# Patient Record
Sex: Female | Born: 1975 | Race: Black or African American | Hispanic: No | Marital: Single | Smoking: Current every day smoker
Health system: Southern US, Community
[De-identification: ages and names within clinical notes are randomized; demographics above are authoritative.]

## PROBLEM LIST (undated history)

## (undated) DIAGNOSIS — J449 Chronic obstructive pulmonary disease, unspecified: Secondary | ICD-10-CM

## (undated) DIAGNOSIS — E119 Type 2 diabetes mellitus without complications: Secondary | ICD-10-CM

## (undated) DIAGNOSIS — I10 Essential (primary) hypertension: Secondary | ICD-10-CM

## (undated) DIAGNOSIS — M1990 Unspecified osteoarthritis, unspecified site: Secondary | ICD-10-CM

---

## 2009-03-07 ENCOUNTER — Emergency Department: Payer: Self-pay

## 2009-12-11 ENCOUNTER — Emergency Department: Payer: Self-pay | Admitting: Emergency Medicine

## 2010-05-24 DIAGNOSIS — G4733 Obstructive sleep apnea (adult) (pediatric): Secondary | ICD-10-CM | POA: Insufficient documentation

## 2010-07-10 ENCOUNTER — Emergency Department: Payer: Self-pay | Admitting: Emergency Medicine

## 2011-01-18 DIAGNOSIS — J4520 Mild intermittent asthma, uncomplicated: Secondary | ICD-10-CM | POA: Insufficient documentation

## 2013-02-28 DIAGNOSIS — F17200 Nicotine dependence, unspecified, uncomplicated: Secondary | ICD-10-CM | POA: Insufficient documentation

## 2014-09-04 ENCOUNTER — Encounter: Payer: Self-pay | Admitting: *Deleted

## 2014-09-04 ENCOUNTER — Emergency Department
Admission: EM | Admit: 2014-09-04 | Discharge: 2014-09-04 | Disposition: A | Discharge status: Home / Self Care | Payer: Self-pay | Attending: Emergency Medicine | Admitting: Emergency Medicine

## 2014-09-04 DIAGNOSIS — J209 Acute bronchitis, unspecified: Secondary | ICD-10-CM | POA: Insufficient documentation

## 2014-09-04 DIAGNOSIS — J40 Bronchitis, not specified as acute or chronic: Secondary | ICD-10-CM

## 2014-09-04 DIAGNOSIS — E119 Type 2 diabetes mellitus without complications: Secondary | ICD-10-CM | POA: Insufficient documentation

## 2014-09-04 DIAGNOSIS — Z720 Tobacco use: Secondary | ICD-10-CM | POA: Insufficient documentation

## 2014-09-04 DIAGNOSIS — I10 Essential (primary) hypertension: Secondary | ICD-10-CM | POA: Insufficient documentation

## 2014-09-04 HISTORY — DX: Type 2 diabetes mellitus without complications: E11.9

## 2014-09-04 HISTORY — DX: Unspecified osteoarthritis, unspecified site: M19.90

## 2014-09-04 HISTORY — DX: Essential (primary) hypertension: I10

## 2014-09-04 HISTORY — DX: Chronic obstructive pulmonary disease, unspecified: J44.9

## 2014-09-04 MED ORDER — HYDROCOD POLST-CPM POLST ER 10-8 MG/5ML PO SUER: 5.0000 mL | Freq: Two times a day (BID) | ORAL | Status: DC

## 2014-09-04 MED ORDER — HYDROCOD POLST-CPM POLST ER 10-8 MG/5ML PO SUER
5.0000 mL | Freq: Once | ORAL | Status: AC
Start: 2014-09-04 — End: 2014-09-04
  Administered 2014-09-04: 5 mL via ORAL
  Filled 2014-09-04: qty 5

## 2014-09-04 MED ORDER — AZITHROMYCIN 250 MG PO TABS
500.0000 mg | ORAL_TABLET | Freq: Once | ORAL | Status: AC
  Administered 2014-09-04: 500 mg via ORAL
  Filled 2014-09-04: qty 2

## 2014-09-04 MED ORDER — AZITHROMYCIN 250 MG PO TABS: 250.0000 mg | ORAL_TABLET | Freq: Every day | ORAL | Status: DC

## 2014-09-04 MED ORDER — ALBUTEROL SULFATE HFA 108 (90 BASE) MCG/ACT IN AERS: 2.0000 | INHALATION_SPRAY | RESPIRATORY_TRACT | Status: DC | PRN

## 2014-09-04 NOTE — Discharge Instructions (Signed)
1. Take antibiotic as prescribed (azithromycin 250 mg 4 days). 2. Take cough medicine as needed (Tussionex 7 day supply). 3. Use Albuterol inhaler 2 puffs every 4 hours as needed for wheezing. 4. Return to the ER for worsening symptoms, persistent vomiting, difficulty breathing or other concerns.  How to Use an Inhaler Proper inhaler technique is very important. Good technique ensures that the medicine reaches the lungs. Poor technique results in depositing the medicine on the tongue and back of the throat rather than in the airways. If you do not use the inhaler with good technique, the medicine will not help you. STEPS TO FOLLOW IF USING AN INHALER WITHOUT AN EXTENSION TUBE 1. Remove the cap from the inhaler. 2. If you are using the inhaler for the first time, you will need to prime it. Shake the inhaler for 5 seconds and release four puffs into the air, away from your face. Ask your health care provider or pharmacist if you have questions about priming your inhaler. 3. Shake the inhaler for 5 seconds before each breath in (inhalation). 4. Position the inhaler so that the top of the canister faces up. 5. Put your index finger on the top of the medicine canister. Your thumb supports the bottom of the inhaler. 6. Open your mouth. 7. Either place the inhaler between your teeth and place your lips tightly around the mouthpiece, or hold the inhaler 1-2 inches away from your open mouth. If you are unsure of which technique to use, ask your health care provider. 8. Breathe out (exhale) normally and as completely as possible. 9. Press the canister down with your index finger to release the medicine. 10. At the same time as the canister is pressed, inhale deeply and slowly until your lungs are completely filled. This should take 4-6 seconds. Keep your tongue down. 11. Hold the medicine in your lungs for 5-10 seconds (10 seconds is best). This helps the medicine get into the small airways of your  lungs. 12. Breathe out slowly, through pursed lips. Whistling is an example of pursed lips. 13. Wait at least 15-30 seconds between puffs. Continue with the above steps until you have taken the number of puffs your health care provider has ordered. Do not use the inhaler more than your health care provider tells you. 14. Replace the cap on the inhaler. 15. Follow the directions from your health care provider or the inhaler insert for cleaning the inhaler. STEPS TO FOLLOW IF USING AN INHALER WITH AN EXTENSION (SPACER) 1. Remove the cap from the inhaler. 2. If you are using the inhaler for the first time, you will need to prime it. Shake the inhaler for 5 seconds and release four puffs into the air, away from your face. Ask your health care provider or pharmacist if you have questions about priming your inhaler. 3. Shake the inhaler for 5 seconds before each breath in (inhalation). 4. Place the open end of the spacer onto the mouthpiece of the inhaler. 5. Position the inhaler so that the top of the canister faces up and the spacer mouthpiece faces you. 6. Put your index finger on the top of the medicine canister. Your thumb supports the bottom of the inhaler and the spacer. 7. Breathe out (exhale) normally and as completely as possible. 8. Immediately after exhaling, place the spacer between your teeth and into your mouth. Close your lips tightly around the spacer. 9. Press the canister down with your index finger to release the medicine. 10. At  the same time as the canister is pressed, inhale deeply and slowly until your lungs are completely filled. This should take 4-6 seconds. Keep your tongue down and out of the way. 11. Hold the medicine in your lungs for 5-10 seconds (10 seconds is best). This helps the medicine get into the small airways of your lungs. Exhale. 12. Repeat inhaling deeply through the spacer mouthpiece. Again hold that breath for up to 10 seconds (10 seconds is best). Exhale  slowly. If it is difficult to take this second deep breath through the spacer, breathe normally several times through the spacer. Remove the spacer from your mouth. 13. Wait at least 15-30 seconds between puffs. Continue with the above steps until you have taken the number of puffs your health care provider has ordered. Do not use the inhaler more than your health care provider tells you. 14. Remove the spacer from the inhaler, and place the cap on the inhaler. 15. Follow the directions from your health care provider or the inhaler insert for cleaning the inhaler and spacer. If you are using different kinds of inhalers, use your quick relief medicine to open the airways 10-15 minutes before using a steroid if instructed to do so by your health care provider. If you are unsure which inhalers to use and the order of using them, ask your health care provider, nurse, or respiratory therapist. If you are using a steroid inhaler, always rinse your mouth with water after your last puff, then gargle and spit out the water. Do not swallow the water. AVOID:  Inhaling before or after starting the spray of medicine. It takes practice to coordinate your breathing with triggering the spray.  Inhaling through the nose (rather than the mouth) when triggering the spray. HOW TO DETERMINE IF YOUR INHALER IS FULL OR NEARLY EMPTY You cannot know when an inhaler is empty by shaking it. A few inhalers are now being made with dose counters. Ask your health care provider for a prescription that has a dose counter if you feel you need that extra help. If your inhaler does not have a counter, ask your health care provider to help you determine the date you need to refill your inhaler. Write the refill date on a calendar or your inhaler canister. Refill your inhaler 7-10 days before it runs out. Be sure to keep an adequate supply of medicine. This includes making sure it is not expired, and that you have a spare inhaler.  SEEK  MEDICAL CARE IF:   Your symptoms are only partially relieved with your inhaler.  You are having trouble using your inhaler.  You have some increase in phlegm. SEEK IMMEDIATE MEDICAL CARE IF:   You feel little or no relief with your inhalers. You are still wheezing and are feeling shortness of breath or tightness in your chest or both.  You have dizziness, headaches, or a fast heart rate.  You have chills, fever, or night sweats.  You have a noticeable increase in phlegm production, or there is blood in the phlegm. MAKE SURE YOU:   Understand these instructions.  Will watch your condition.  Will get help right away if you are not doing well or get worse. Document Released: 01/28/2000 Document Revised: 11/20/2012 Document Reviewed: 08/29/2012 [facility] Patient Information 2015 [place], [place]. This information is not intended to replace advice given to you by your health care provider. Make sure you discuss any questions you have with your health care provider.

## 2014-09-04 NOTE — ED Notes (Signed)
Pt has a cough and sore throat.  Sx began this am.  cig smoker.  Nonproductive cough.  No fever.

## 2014-09-04 NOTE — ED Notes (Signed)
Patient to ED with multiple medical complaints. Cough, congestion, swelling, sore throat.

## 2014-09-04 NOTE — ED Provider Notes (Signed)
[facility] Emergency Department Provider Note  ____________________________________________  Time seen: Approximately 2:03 AM  I have reviewed the triage vital signs and the nursing notes.   HISTORY  Chief Complaint Cough and Sore Throat    HPI Tamara Higgins is a 39 y.o. female who presents to the ED from home with a one-day history of nonproductive cough and sore throat associated with chills. Denies fever, chest pain, shortness of breath, abdominal pain, diarrhea, headache. States she had 2 episodes of post tussive emesis.   Past Medical History  Diagnosis Date  . Hypertension   . Diabetes mellitus without complication   . Arthritis   . COPD (chronic obstructive pulmonary disease)     There are no active problems to display for this patient.   Past Surgical History  Procedure Laterality Date  . Cesarean section      No current outpatient prescriptions on file.  Allergies Ciprofloxacin  No family history on file.  Social History History  Substance Use Topics  . Smoking status: Current Every Day Smoker  . Smokeless tobacco: Not on file  . Alcohol Use: No    Review of Systems Constitutional: No fever. Positive for chills Eyes: No visual changes. ENT: Positive for sore throat. Cardiovascular: Denies chest pain. Respiratory: Positive for nonproductive cough. Denies shortness of breath. Gastrointestinal: No abdominal pain.  No nausea, no vomiting.  No diarrhea.  No constipation. Genitourinary: Negative for dysuria. Musculoskeletal: Negative for back pain. Skin: Negative for rash. Neurological: Negative for headaches, focal weakness or numbness.  10-point ROS otherwise negative.  ____________________________________________   PHYSICAL EXAM:  VITAL SIGNS: ED Triage Vitals  Enc Vitals Group     BP 09/04/14 0136 176/106 mmHg     Pulse Rate 09/04/14 0136 103     Resp 09/04/14 0136 22     Temp 09/04/14 0136 98.3 F (36.8 C)      Temp Source 09/04/14 0136 Oral     SpO2 09/04/14 0136 96 %     Weight 09/04/14 0136 322 lb (146.058 kg)     Height 09/04/14 0136  (1.575 m)     Head Cir --      Peak Flow --      Pain Score 09/04/14 0137 8     Pain Loc --      Pain Edu? --      Excl. in GC? --     Constitutional: Alert and oriented. Well appearing and in no acute distress. Eyes: Conjunctivae are normal. PERRL. EOMI. Head: Atraumatic. Nose: Nasal congestion noted. Mouth/Throat: Mucous membranes are moist.  Oropharynx mildly erythematous. No tonsillar swelling, exudates or peritonsillar abscess. There is no hoarse or muffled voice. There is no drooling. Neck: No stridor.   Hematological/Lymphatic/Immunilogical: No cervical lymphadenopathy. Cardiovascular: Normal rate, regular rhythm. Grossly normal heart sounds.  Good peripheral circulation. Respiratory: Normal respiratory effort.  No retractions. Lungs CTAB. Coarse nonproductive cough noted. Gastrointestinal: Obese. Soft and nontender. No distention. No abdominal bruits. No CVA tenderness. Musculoskeletal: No lower extremity tenderness nor edema.  No joint effusions. Neurologic:  Normal speech and language. No gross focal neurologic deficits are appreciated. No gait instability. Skin:  Skin is warm, dry and intact. No rash noted. Psychiatric: Mood and affect are normal. Speech and behavior are normal.  ____________________________________________   LABS (all labs ordered are listed, but only abnormal results are displayed)  Labs Reviewed - No data to display ____________________________________________  EKG  None ____________________________________________  RADIOLOGY  None ____________________________________________  PROCEDURES  Procedure(s) performed: None  Critical Care performed: No  ____________________________________________   INITIAL IMPRESSION / ASSESSMENT AND PLAN / ED COURSE  Pertinent labs & imaging results that were  available during my care of the patient were reviewed by me and considered in my medical decision making (see chart for details).  39 year old female who presents with cough and cold symptoms consistent with bronchitis. Will treat with Z-Pak, Tussionex and albuterol inhaler. Smoking cessation > 3 minutes. Strict return precautions given. Patient verbalizes understanding and agrees with plan of care. ____________________________________________   FINAL CLINICAL IMPRESSION(S) / ED DIAGNOSES  Final diagnoses:  Bronchitis      Irean Hong, MD 09/04/14 [phone]

## 2016-02-21 ENCOUNTER — Emergency Department: Payer: Self-pay

## 2016-02-21 ENCOUNTER — Emergency Department
Admission: EM | Admit: 2016-02-21 | Discharge: 2016-02-21 | Disposition: A | Discharge status: Home / Self Care | Payer: Self-pay | Attending: Student in an Organized Health Care Education/Training Program | Admitting: Student in an Organized Health Care Education/Training Program

## 2016-02-21 DIAGNOSIS — J449 Chronic obstructive pulmonary disease, unspecified: Secondary | ICD-10-CM | POA: Insufficient documentation

## 2016-02-21 DIAGNOSIS — F17200 Nicotine dependence, unspecified, uncomplicated: Secondary | ICD-10-CM | POA: Insufficient documentation

## 2016-02-21 DIAGNOSIS — J209 Acute bronchitis, unspecified: Secondary | ICD-10-CM | POA: Insufficient documentation

## 2016-02-21 DIAGNOSIS — E119 Type 2 diabetes mellitus without complications: Secondary | ICD-10-CM | POA: Insufficient documentation

## 2016-02-21 DIAGNOSIS — I10 Essential (primary) hypertension: Secondary | ICD-10-CM | POA: Insufficient documentation

## 2016-02-21 LAB — RAPID INFLUENZA A&B ANTIGENS
Influenza A (ARMC): NEGATIVE
Influenza B (ARMC): NEGATIVE

## 2016-02-21 MED ORDER — AZITHROMYCIN 250 MG PO TABS: ORAL_TABLET | ORAL | 0 refills | Status: AC

## 2016-02-21 MED ORDER — AZITHROMYCIN 500 MG PO TABS
500.0000 mg | ORAL_TABLET | Freq: Once | ORAL | Status: AC
Start: 2016-02-21 — End: 2016-02-21
  Administered 2016-02-21: 500 mg via ORAL
  Filled 2016-02-21: qty 1

## 2016-02-21 NOTE — ED Provider Notes (Signed)
[facility] Emergency Department Provider Note  ____________________________________________  Time seen: Approximately 5:01 PM  I have reviewed the triage vital signs and the nursing notes.   HISTORY  Chief Complaint Cough and Nasal Congestion    HPI Tamara Higgins is a 41 y.o. female presenting to the emergency department with 2 weeks of productive cough, congestion and fever. Patient states that she has purulent sputum production. Associated symptoms include fatigue and dyspnea with exertion. Fever has been as high as 101F assessed orally. Patient states that she "has no energy". Patient's symptoms have been severe enough for her to miss work. Patient denies recent travel. She smokes daily. Patient denies chest pain, abdominal pain, nausea, vomiting, dysuria, hematuria, diarrhea and constipation. She works as a Child psychotherapist. Her favorite pastime is bowling. Patient has tried Tylenol, which has not relieved her symptoms.   Past Medical History:  Diagnosis Date  . Arthritis   . COPD (chronic obstructive pulmonary disease) (HCC)   . Diabetes mellitus without complication (HCC)   . Hypertension     There are no active problems to display for this patient.   Past Surgical History:  Procedure Laterality Date  . CESAREAN SECTION      Prior to Admission medications   Medication Sig Start Date End Date Taking? Authorizing Provider  albuterol (PROVENTIL HFA;VENTOLIN HFA) 108 (90 BASE) MCG/ACT inhaler Inhale 2 puffs into the lungs every 4 (four) hours as needed for wheezing or shortness of breath. 09/04/14   Irean Hong, MD  azithromycin (ZITHROMAX Z-PAK) 250 MG tablet Take 2 tablets (500 mg) on  Day 1,  followed by 1 tablet (250 mg) once daily on Days 2 through 5. 02/21/16 02/26/16  Orvil Feil, PA-C  chlorpheniramine-HYDROcodone (TUSSIONEX PENNKINETIC ER) 10-8 MG/5ML SUER Take 5 mLs by mouth 2 (two) times daily. 09/04/14   Irean Hong, MD     Allergies Ciprofloxacin  History reviewed. No pertinent family history.  Social History Social History  Substance Use Topics  . Smoking status: Current Every Day Smoker  . Smokeless tobacco: Not on file  . Alcohol use No     Review of Systems  Constitutional: Patient has had fever Eyes: No visual changes. No discharge ENT: Patient has had non-productive cough and congestion.  Cardiovascular: no chest pain. Respiratory: Patient has had dyspnea with exertion.  Gastrointestinal: No abdominal pain.  No nausea, no vomiting.  No diarrhea.  No constipation. Musculoskeletal: Negative for musculoskeletal pain. Skin: Negative for rash, abrasions, lacerations, ecchymosis. Neurological: Negative for headaches, focal weakness or numbness. 10-point ROS otherwise negative.  ____________________________________________   PHYSICAL EXAM:  VITAL SIGNS: ED Triage Vitals  Enc Vitals Group     BP 02/21/16 1629 (!) 184/96     Pulse Rate 02/21/16 1629 88     Resp 02/21/16 1629 18     Temp 02/21/16 1629 98 F (36.7 C)     Temp Source 02/21/16 1629 Oral     SpO2 02/21/16 1629 97 %     Weight 02/21/16 1629 (!) 325 lb (147.4 kg)     Height 02/21/16 1629 5\' 2"  (1.575 m)     Head Circumference --      Peak Flow --      Pain Score 02/21/16 1630 10     Pain Loc --      Pain Edu? --      Excl. in GC? --     Constitutional: Alert and oriented. She is sitting in chair.  Maintains good eye contact. Eyes: Palpebral and bulbar conjunctiva are nonerythematous bilaterally. PERRL. EOMI. No scleral icterus bilaterally. Head: Atraumatic. ENT:      Ears: Tympanic membranes are pearly bilaterally without effusion, erythema or purulent exudate. Bony landmarks are visualized bilaterally.       Nose: Skin overlying nares is without erythema. Nasal turbinates are non-erythematous. Nasal septum is midline.      Mouth/Throat: Mucous membranes are moist. Posterior pharynx is mildly erythematous. No  tonsillar exudate, hypertrophy or petechiae visualized. Uvula is midline. Neck: Full range of motion. No pain with neck flexion. Hematological/Lymphatic/Immunilogical: No cervical lymphadenopathy.  Cardiovascular: No scars of the skin overlying the anterior or posterior chest wall. No pain with palpation over the anterior and posterior chest wall. Normal rate, regular rhythm. Normal S1 and S2. No murmurs, gallops or rubs auscultated.  Respiratory: Trachea is midline. No retractions or presence of deformity. Thoracic expansion is symmetric with unaccentuated tactile fremitus. Resonant and symmetric percussion tones bilaterally. On auscultation, adventitious sounds are absent.  Neurologic:  Normal for age. No gross focal neurologic deficits are appreciated.  Skin:  Skin is warm, dry and intact. No rash noted. No clubbing or cyanosis of the digits visualized.  Psychiatric: Mood and affect are normal for age. Speech and behavior are normal.   ____________________________________________   LABS (all labs ordered are listed, but only abnormal results are displayed)  Labs Reviewed  RAPID INFLUENZA A&B ANTIGENS ([facility] ONLY)   ____________________________________________  EKG   ____________________________________________  RADIOLOGY Geraldo PitterI, Jaclyn M Woods, personally viewed and evaluated these images (plain radiographs) as part of my medical decision making, as well as reviewing the written report by the radiologist.   Dg Chest 2 View  Result Date: 02/21/2016 CLINICAL DATA:  41 year old female with fever for 1 week with increased weakness and productive cough. Initial encounter. EXAM: CHEST  2 VIEW COMPARISON:  Chest radiographs and CT 07/10/2010. FINDINGS: Mediastinal contours are stable and within normal limits. Stable lung volumes. Large body habitus. Visualized tracheal air column is within normal limits. No pneumothorax, pulmonary edema, pleural effusion or confluent pulmonary opacity. Negative  visible bowel gas pattern. No acute osseous abnormality identified. IMPRESSION: No acute cardiopulmonary abnormality. Electronically Signed   By: Odessa FlemingH  Hall M.D.   On: 02/21/2016 17:20    ____________________________________________    PROCEDURES  Procedure(s) performed:    Procedures    Medications  azithromycin (ZITHROMAX) tablet 500 mg (500 mg Oral Given 02/21/16 1822)     ____________________________________________   INITIAL IMPRESSION / ASSESSMENT AND PLAN / ED COURSE  Pertinent labs & imaging results that were available during my care of the patient were reviewed by me and considered in my medical decision making (see chart for details).  Review of the [place] CSRS was performed in accordance of the [facility] prior to dispensing any controlled drugs.  Clinical Course    Assessment and plan: Acute Bronchitis: Patient presents the emergency department with 2 weeks of productive cough. Patient has experienced dyspnea with exertion, purulent sputum production and fatigue. DG chest conducted in the emergency department does not reveal consolidations or findings consistent with pneumonia. Patient was discharged with azithromycin for acute bronchitis. Vital signs are reassuring at this time aside from hypertension. Strict return precautions were given. All patient questions were answered. ____________________________________________  FINAL CLINICAL IMPRESSION(S) / ED DIAGNOSES  Final diagnoses:  Acute bronchitis, unspecified organism      NEW MEDICATIONS STARTED DURING THIS VISIT:  Discharge Medication List as of 02/21/2016  6:05 PM  This chart was dictated using voice recognition software/Dragon. Despite best efforts to proofread, errors can occur which can change the meaning. Any change was purely unintentional.    Orvil Feil, PA-C 02/21/16 1832    Willy Eddy, MD 02/21/16 2104

## 2016-02-21 NOTE — ED Triage Notes (Signed)
Pt presents to ED with multiple complaints- cough, congestion, weakness, fever. Is taking tylenol for fever.

## 2016-02-21 NOTE — ED Notes (Signed)
Pt is in xray

## 2016-03-10 DIAGNOSIS — N200 Calculus of kidney: Secondary | ICD-10-CM | POA: Insufficient documentation

## 2016-05-16 ENCOUNTER — Emergency Department: Payer: BLUE CROSS/BLUE SHIELD

## 2016-05-16 ENCOUNTER — Encounter: Payer: Self-pay | Admitting: Emergency Medicine

## 2016-05-16 DIAGNOSIS — I10 Essential (primary) hypertension: Secondary | ICD-10-CM | POA: Insufficient documentation

## 2016-05-16 DIAGNOSIS — R05 Cough: Secondary | ICD-10-CM | POA: Diagnosis present

## 2016-05-16 DIAGNOSIS — J069 Acute upper respiratory infection, unspecified: Secondary | ICD-10-CM | POA: Insufficient documentation

## 2016-05-16 DIAGNOSIS — J449 Chronic obstructive pulmonary disease, unspecified: Secondary | ICD-10-CM | POA: Insufficient documentation

## 2016-05-16 DIAGNOSIS — E119 Type 2 diabetes mellitus without complications: Secondary | ICD-10-CM | POA: Diagnosis not present

## 2016-05-16 DIAGNOSIS — F17200 Nicotine dependence, unspecified, uncomplicated: Secondary | ICD-10-CM | POA: Insufficient documentation

## 2016-05-16 NOTE — ED Triage Notes (Signed)
Patient ambulatory to triage with steady gait, without difficulty or distress noted; pt reports prod cough green sputum tonight, chills, congestion

## 2016-05-17 ENCOUNTER — Encounter: Payer: Self-pay | Admitting: Emergency Medicine

## 2016-05-17 ENCOUNTER — Emergency Department
Admission: EM | Admit: 2016-05-17 | Discharge: 2016-05-17 | Disposition: A | Discharge status: Home / Self Care | Payer: BLUE CROSS/BLUE SHIELD | Attending: Emergency Medicine | Admitting: Emergency Medicine

## 2016-05-17 DIAGNOSIS — J069 Acute upper respiratory infection, unspecified: Secondary | ICD-10-CM

## 2016-05-17 DIAGNOSIS — B9789 Other viral agents as the cause of diseases classified elsewhere: Secondary | ICD-10-CM

## 2016-05-17 HISTORY — DX: Morbid (severe) obesity due to excess calories: E66.01

## 2016-05-17 MED ORDER — AZITHROMYCIN 250 MG PO TABS: ORAL_TABLET | ORAL | 0 refills | Status: DC

## 2016-05-17 MED ORDER — AZITHROMYCIN 500 MG PO TABS
500.0000 mg | ORAL_TABLET | Freq: Once | ORAL | Status: AC
  Administered 2016-05-17: 500 mg via ORAL
  Filled 2016-05-17: qty 1

## 2016-05-17 MED ORDER — HYDROCODONE-HOMATROPINE 5-1.5 MG/5ML PO SYRP: 5.0000 mL | ORAL_SOLUTION | Freq: Four times a day (QID) | ORAL | 0 refills | Status: DC | PRN

## 2016-05-17 MED ORDER — ALBUTEROL SULFATE HFA 108 (90 BASE) MCG/ACT IN AERS: INHALATION_SPRAY | RESPIRATORY_TRACT | 1 refills | Status: DC

## 2016-05-17 NOTE — ED Provider Notes (Signed)
[facility] Emergency Department Provider Note  ____________________________________________   First MD Initiated Contact with Patient 05/17/16 [phone]     (approximate)  I have reviewed the triage vital signs and the nursing notes.   HISTORY  Chief Complaint Cough and Nasal Congestion    HPI Tamara Higgins is a 41 y.o. female with a medical history as listed below who presents for evaluation of gradual onset respiratory symptoms including subjective fever, frequent productive cough, nasal congestion, runny nose, and general malaise.  She gets this kind of infection several times a year and states that she never gets better without azithromycin.  She has been prescribed an albuterol inhaler in the past which helps but she is out.  She recently established primary care with [facility] in [place].  She describes her symptoms as severe and nothing makes it better or worse.  She has a 46-year-old son who was recently ill with similar symptoms.  She denies chest pain, abdominal pain, nausea, vomiting, dysuria.   Past Medical History:  Diagnosis Date  . Arthritis   . COPD (chronic obstructive pulmonary disease) (HCC)   . Diabetes mellitus without complication (HCC)   . Hypertension   . Morbid obesity (HCC)     There are no active problems to display for this patient.   Past Surgical History:  Procedure Laterality Date  . CESAREAN SECTION      Prior to Admission medications   Medication Sig Start Date End Date Taking? Authorizing Provider  albuterol (PROVENTIL HFA;VENTOLIN HFA) 108 (90 Base) MCG/ACT inhaler Inhale 2-4 puffs by mouth every 4 hours as needed for wheezing, cough, and/or shortness of breath 05/17/16   Loleta Rose, MD  azithromycin (ZITHROMAX) 250 MG tablet Take 1 tablet PO daily for 4 more days starting the day after your visit to the Emergency Department 05/17/16   Loleta Rose, MD  HYDROcodone-homatropine [facility]) 5-1.5 MG/5ML syrup Take 5 mLs by mouth  every 6 (six) hours as needed for cough. 05/17/16   Loleta Rose, MD    Allergies Ciprofloxacin  History reviewed. No pertinent family history.  Social History Social History  Substance Use Topics  . Smoking status: Current Every Day Smoker  . Smokeless tobacco: Never Used  . Alcohol use No    Review of Systems Constitutional: Subjective fever/chills Eyes: No visual changes. ENT: Nasal congestion, runny nose Cardiovascular: Denies chest pain. Respiratory: Mild shortness of breath with frequent productive cough and some wheezing Gastrointestinal: No abdominal pain.  No nausea, no vomiting.  No diarrhea.  No constipation. Genitourinary: Negative for dysuria. Musculoskeletal: Negative for back pain. Skin: Negative for rash. Neurological: Negative for headaches, focal weakness or numbness.  10-point ROS otherwise negative.  ____________________________________________   PHYSICAL EXAM:  VITAL SIGNS: ED Triage Vitals  Enc Vitals Group     BP 05/16/16 2341 (!) 164/98     Pulse Rate 05/16/16 2341 100     Resp 05/16/16 2341 (!) 22     Temp 05/16/16 2341 98.3 F (36.8 C)     Temp Source 05/16/16 2341 Oral     SpO2 05/16/16 2341 94 %     Weight 05/16/16 2339 (!) 370 lb (167.8 kg)     Height 05/16/16 2339  (1.575 m)     Head Circumference --      Peak Flow --      Pain Score --      Pain Loc --      Pain Edu? --  Excl. in GC? --     Constitutional: Alert and oriented. Appears uncomfortable but nontoxic Eyes: Conjunctivae are normal. PERRL. EOMI. Head: Atraumatic. Nose: +congestion/rhinnorhea Mouth/Throat: Mucous membranes are moist. Neck: No stridor.  No meningeal signs.   Cardiovascular: Normal rate, regular rhythm. Good peripheral circulation. Grossly normal heart sounds. Respiratory: Normal respiratory effort.  No retractions.  Mild expiratory wheezing throughout, slightly more noticeable in the right upper lung field Gastrointestinal: Morbid obesity.   Soft and nontender. No distention.  Musculoskeletal: No lower extremity tenderness nor edema. No gross deformities of extremities. Neurologic:  Normal speech and language. No gross focal neurologic deficits are appreciated.  Skin:  Skin is warm, dry and intact. No rash noted. Psychiatric: Mood and affect are normal. Speech and behavior are normal.  ____________________________________________   LABS (all labs ordered are listed, but only abnormal results are displayed)  Labs Reviewed - No data to display ____________________________________________  EKG  None - EKG not ordered by ED physician ____________________________________________  RADIOLOGY   Dg Chest 2 View  Result Date: 05/17/2016 CLINICAL DATA:  Productive cough tonight.  Chills. EXAM: CHEST  2 VIEW COMPARISON:  02/21/2016 FINDINGS: The lungs are clear. The pulmonary vasculature is normal. Heart size is normal. Hilar and mediastinal contours are unremarkable. There is no pleural effusion. IMPRESSION: No active cardiopulmonary disease. Electronically Signed   By: Ellery Plunk M.D.   On: 05/17/2016 00:01    ____________________________________________   PROCEDURES  Critical Care performed: No   Procedure(s) performed:   Procedures   ____________________________________________   INITIAL IMPRESSION / ASSESSMENT AND PLAN / ED COURSE  Pertinent labs & imaging results that were available during my care of the patient were reviewed by me and considered in my medical decision making (see chart for details).  No evidence of pneumonia on chest x-ray according to the radiology report.  The patient has signs and symptoms most consistent with a viral respiratory infection.  However she is adamant that she does not improve without antibiotics and that she has always gotten them in the past.  Given that she is a smoker with frequent episodes of bronchitis the azithromycin may help her so I will give her a first dose  tonight and a prescription for 4 more days.  I am also giving her another prescription for albuterol inhaler and cough medicine.  I encouraged her to follow up tomorrow or within the next few days with her primary care doctor.  I gave my usual and customary return precautions.   She understands and agrees with the plan.   Clinical Course as of May 18 302  Wed May 17, 2016  0253 I reviewed the patient's prescription history over the last 12 months in the multi-state controlled substances database(s) that includes [place], [place], [place], [place], [place], [place], [place], [place], [place], [place], [place], [place], [place], and [place].  The patient has filled no controlled substances during that time.   [CF]    Clinical Course User Index [CF] Loleta Rose, MD    ____________________________________________  FINAL CLINICAL IMPRESSION(S) / ED DIAGNOSES  Final diagnoses:  Viral URI with cough     MEDICATIONS GIVEN DURING THIS VISIT:  Medications  azithromycin (ZITHROMAX) tablet 500 mg (not administered)     NEW OUTPATIENT MEDICATIONS STARTED DURING THIS VISIT:  New Prescriptions   ALBUTEROL (PROVENTIL HFA;VENTOLIN HFA) 108 (90 BASE) MCG/ACT INHALER    Inhale 2-4 puffs by mouth every 4 hours as needed for wheezing, cough, and/or shortness of  breath   AZITHROMYCIN (ZITHROMAX) 250 MG TABLET    Take 1 tablet PO daily for 4 more days starting the day after your visit to the Emergency Department   HYDROCODONE-HOMATROPINE (HYCODAN) 5-1.5 MG/5ML SYRUP    Take 5 mLs by mouth every 6 (six) hours as needed for cough.    Modified Medications   No medications on file    Discontinued Medications   ALBUTEROL (PROVENTIL HFA;VENTOLIN HFA) 108 (90 BASE) MCG/ACT INHALER    Inhale 2 puffs into the lungs every 4 (four) hours as needed for wheezing or shortness of breath.   CHLORPHENIRAMINE-HYDROCODONE (TUSSIONEX PENNKINETIC ER) 10-8 MG/5ML SUER    Take 5  mLs by mouth 2 (two) times daily.     Note:  This document was prepared using Dragon voice recognition software and may include unintentional dictation errors.    Loleta Rose, MD 05/17/16 [phone]

## 2016-05-17 NOTE — Discharge Instructions (Signed)

## 2017-09-14 DIAGNOSIS — M1990 Unspecified osteoarthritis, unspecified site: Secondary | ICD-10-CM | POA: Insufficient documentation

## 2017-11-19 DIAGNOSIS — E118 Type 2 diabetes mellitus with unspecified complications: Secondary | ICD-10-CM

## 2018-07-27 ENCOUNTER — Other Ambulatory Visit: Payer: Self-pay

## 2018-07-27 ENCOUNTER — Inpatient Hospital Stay
Admission: EM | Admit: 2018-07-27 | Discharge: 2018-07-30 | DRG: 439 | Disposition: A | Discharge status: Home / Self Care | Payer: Medicaid Other | Attending: Internal Medicine | Admitting: Internal Medicine

## 2018-07-27 ENCOUNTER — Emergency Department: Payer: Medicaid Other

## 2018-07-27 DIAGNOSIS — I119 Hypertensive heart disease without heart failure: Secondary | ICD-10-CM | POA: Diagnosis present

## 2018-07-27 DIAGNOSIS — F17210 Nicotine dependence, cigarettes, uncomplicated: Secondary | ICD-10-CM | POA: Diagnosis present

## 2018-07-27 DIAGNOSIS — Z8249 Family history of ischemic heart disease and other diseases of the circulatory system: Secondary | ICD-10-CM

## 2018-07-27 DIAGNOSIS — R059 Cough, unspecified: Secondary | ICD-10-CM

## 2018-07-27 DIAGNOSIS — Z87898 Personal history of other specified conditions: Secondary | ICD-10-CM

## 2018-07-27 DIAGNOSIS — Z79899 Other long term (current) drug therapy: Secondary | ICD-10-CM

## 2018-07-27 DIAGNOSIS — Z716 Tobacco abuse counseling: Secondary | ICD-10-CM

## 2018-07-27 DIAGNOSIS — Z881 Allergy status to other antibiotic agents status: Secondary | ICD-10-CM

## 2018-07-27 DIAGNOSIS — J449 Chronic obstructive pulmonary disease, unspecified: Secondary | ICD-10-CM | POA: Diagnosis present

## 2018-07-27 DIAGNOSIS — Z20828 Contact with and (suspected) exposure to other viral communicable diseases: Secondary | ICD-10-CM | POA: Diagnosis present

## 2018-07-27 DIAGNOSIS — K853 Drug induced acute pancreatitis without necrosis or infection: Principal | ICD-10-CM | POA: Diagnosis present

## 2018-07-27 DIAGNOSIS — I160 Hypertensive urgency: Secondary | ICD-10-CM | POA: Diagnosis present

## 2018-07-27 DIAGNOSIS — Z833 Family history of diabetes mellitus: Secondary | ICD-10-CM

## 2018-07-27 DIAGNOSIS — Z7984 Long term (current) use of oral hypoglycemic drugs: Secondary | ICD-10-CM

## 2018-07-27 DIAGNOSIS — Z6841 Body Mass Index (BMI) 40.0 and over, adult: Secondary | ICD-10-CM

## 2018-07-27 DIAGNOSIS — R7989 Other specified abnormal findings of blood chemistry: Secondary | ICD-10-CM | POA: Diagnosis present

## 2018-07-27 DIAGNOSIS — E119 Type 2 diabetes mellitus without complications: Secondary | ICD-10-CM | POA: Diagnosis present

## 2018-07-27 DIAGNOSIS — K859 Acute pancreatitis without necrosis or infection, unspecified: Secondary | ICD-10-CM | POA: Diagnosis present

## 2018-07-27 DIAGNOSIS — R05 Cough: Secondary | ICD-10-CM

## 2018-07-27 LAB — COMPREHENSIVE METABOLIC PANEL
ALT: 11 U/L (ref 0–44)
AST: 14 U/L — ABNORMAL LOW (ref 15–41)
Albumin: 3.5 g/dL (ref 3.5–5.0)
Alkaline Phosphatase: 82 U/L (ref 38–126)
Anion gap: 10 (ref 5–15)
BUN: 11 mg/dL (ref 6–20)
CO2: 28 mmol/L (ref 22–32)
Calcium: 8.9 mg/dL (ref 8.9–10.3)
Chloride: 100 mmol/L (ref 98–111)
Creatinine, Ser: 0.98 mg/dL (ref 0.44–1.00)
GFR calc Af Amer: >60 mL/min (ref >60)
GFR calc non Af Amer: >60 mL/min (ref >60)
Glucose, Bld: 172 mg/dL — ABNORMAL HIGH (ref 70–99)
Potassium: 4 mmol/L (ref 3.5–5.1)
Sodium: 138 mmol/L (ref 135–145)
Total Bilirubin: 0.3 mg/dL (ref 0.3–1.2)
Total Protein: 7.8 g/dL (ref 6.5–8.1)

## 2018-07-27 LAB — URINALYSIS, COMPLETE (UACMP) WITH MICROSCOPIC
Bacteria, UA: NONE SEEN
Bilirubin Urine: NEGATIVE
Glucose, UA: NEGATIVE mg/dL
Hgb urine dipstick: NEGATIVE
Ketones, ur: NEGATIVE mg/dL
Leukocytes,Ua: NEGATIVE
Nitrite: NEGATIVE
Protein, ur: >=300 mg/dL — AB
Specific Gravity, Urine: 1.018 (ref 1.005–1.030)
pH: 7 (ref 5.0–8.0)

## 2018-07-27 LAB — CBC
HCT: 44.1 % (ref 36.0–46.0)
Hemoglobin: 14.2 g/dL (ref 12.0–15.0)
MCH: 28.2 pg (ref 26.0–34.0)
MCHC: 32.2 g/dL (ref 30.0–36.0)
MCV: 87.5 fL (ref 80.0–100.0)
Platelets: 365 10*3/uL (ref 150–400)
RBC: 5.04 MIL/uL (ref 3.87–5.11)
RDW: 14.5 % (ref 11.5–15.5)
WBC: 15.8 10*3/uL — ABNORMAL HIGH (ref 4.0–10.5)
nRBC: 0 % (ref 0.0–0.2)

## 2018-07-27 LAB — LIPASE, BLOOD: Lipase: 586 U/L — ABNORMAL HIGH (ref 11–51)

## 2018-07-27 MED ORDER — FAMOTIDINE IN NACL 20-0.9 MG/50ML-% IV SOLN
20.0000 mg | Freq: Once | INTRAVENOUS | Status: AC
Start: 2018-07-27 — End: 2018-07-28
  Administered 2018-07-28: 01:00:00 20 mg via INTRAVENOUS
  Filled 2018-07-27: qty 50

## 2018-07-27 MED ORDER — ONDANSETRON HCL 4 MG/2ML IJ SOLN: 4.0000 mg | Freq: Once | INTRAMUSCULAR | Status: DC | PRN

## 2018-07-27 MED ORDER — SODIUM CHLORIDE 0.9% FLUSH: 3.0000 mL | Freq: Once | INTRAVENOUS | Status: DC

## 2018-07-27 MED ORDER — MORPHINE SULFATE (PF) 4 MG/ML IV SOLN
4.0000 mg | Freq: Once | INTRAVENOUS | Status: AC
  Administered 2018-07-28: 01:00:00 4 mg via INTRAVENOUS
  Filled 2018-07-27: qty 1

## 2018-07-27 MED ORDER — SODIUM CHLORIDE 0.9 % IV BOLUS
1000.0000 mL | Freq: Once | INTRAVENOUS | Status: AC
  Administered 2018-07-28: 01:00:00 1000 mL via INTRAVENOUS

## 2018-07-27 MED ORDER — IOHEXOL 300 MG/ML  SOLN
125.0000 mL | Freq: Once | INTRAMUSCULAR | Status: AC | PRN
  Administered 2018-07-27: 150 mL via INTRAVENOUS

## 2018-07-27 NOTE — ED Provider Notes (Signed)
[facility] Emergency Department Provider Note ____________________________________________   First MD Initiated Contact with Patient 07/27/18 2307     (approximate)  I have reviewed the triage vital signs and the nursing notes.   HISTORY  Chief Complaint Abdominal Pain    HPI Tamara Higgins is a 43 y.o. female with PMH as noted below who presents with upper abdominal pain over the last 2 days, constant but coming in more severe waves, nonradiating, and associated with nausea but no vomiting.  The patient states she intermittently has pain radiating up to her chest and throat which feels like reflux.  She states that she has had similar pain in the past but not as severe as this episode.  She took Tums and simethicone with no relief.   Past Medical History:  Diagnosis Date  . Arthritis   . COPD (chronic obstructive pulmonary disease) (HCC)   . Diabetes mellitus without complication (HCC)   . Hypertension   . Morbid obesity [facility](HCC)     Patient Active Problem List   Diagnosis Date Noted  . Pancreatitis 07/28/2018    Past Surgical History:  Procedure Laterality Date  . CESAREAN SECTION      Prior to Admission medications   Medication Sig Start Date End Date Taking? Authorizing Provider  acetaminophen (TYLENOL) 650 MG CR tablet Take 650 mg by mouth every 8 (eight) hours as needed for pain.   Yes [provider]  lisinopril (ZESTRIL) 20 MG tablet Take 20 mg by mouth daily.   Yes [provider]  metFORMIN (GLUCOPHAGE-XR) 500 MG 24 hr tablet Take 1,000 mg by mouth daily with breakfast.   Yes [provider]  Simethicone 125 MG CAPS Take 125 mg by mouth every 6 (six) hours as needed.   Yes [provider]    Allergies Ciprofloxacin  No family history on file.  Social History Social History   Tobacco Use  . Smoking status: Current Every Day Smoker  . Smokeless tobacco: Never Used  Substance Use Topics  .  Alcohol use: No  . Drug use: Not on file    Review of Systems  Constitutional: No fever/chills. Eyes: No redness. ENT: No neck pain. Cardiovascular: Denies chest pain. Respiratory: Denies shortness of breath. Gastrointestinal: Positive for nausea. Genitourinary: Negative for dysuria.  Musculoskeletal: Negative for back pain. Skin: Negative for rash. Neurological: Negative for headache.   ____________________________________________   PHYSICAL EXAM:  VITAL SIGNS: ED Triage Vitals  Enc Vitals Group     BP 07/27/18 2152 (!) 200/125     Pulse Rate 07/27/18 2152 (!) 109     Resp 07/27/18 2152 (!) 24     Temp 07/27/18 2152 98.3 F (36.8 C)     Temp Source 07/27/18 2152 Oral     SpO2 07/27/18 2152 97 %     Weight 07/27/18 2156 (!) 375 lb (170.1 kg)     Height 07/27/18 2156 5\' 2"  (1.575 m)     Head Circumference --      Peak Flow --      Pain Score 07/27/18 2202 8     Pain Loc --      Pain Edu? --      Excl. in GC? --     Constitutional: Alert and oriented.  Uncomfortable appearing but in no acute distress. Eyes: Conjunctivae are normal.  No scleral icterus. Head: Atraumatic. Nose: No congestion/rhinnorhea. Mouth/Throat: Mucous membranes are slightly dry.   Neck: Normal range of motion.  Cardiovascular:  Good peripheral circulation. Respiratory: Normal respiratory effort.  No retractions.  Gastrointestinal: Soft with mild epigastric tenderness.  No distention.  Genitourinary: No flank tenderness. Musculoskeletal: Extremities warm and well perfused.  Neurologic:  Normal speech and language. No gross focal neurologic deficits are appreciated.  Skin:  Skin is warm and dry. No rash noted. Psychiatric: Mood and affect are normal. Speech and behavior are normal.  ____________________________________________   LABS (all labs ordered are listed, but only abnormal results are displayed)  Labs Reviewed  LIPASE, BLOOD - Abnormal; Notable for the following components:       Result Value   Lipase 586 (*)    All other components within normal limits  COMPREHENSIVE METABOLIC PANEL - Abnormal; Notable for the following components:   Glucose, Bld 172 (*)    AST 14 (*)    All other components within normal limits  CBC - Abnormal; Notable for the following components:   WBC 15.8 (*)    All other components within normal limits  URINALYSIS, COMPLETE (UACMP) WITH MICROSCOPIC - Abnormal; Notable for the following components:   Color, Urine YELLOW (*)    APPearance CLOUDY (*)    Protein, ur >=300 (*)    All other components within normal limits  TROPONIN I - Abnormal; Notable for the following components:   Troponin I 0.06 (*)    All other components within normal limits  LACTATE DEHYDROGENASE  POC URINE PREG, ED   ____________________________________________  EKG  ED ECG REPORT I, Arta Silence, the attending physician, personally viewed and interpreted this ECG.  Date: 07/27/2018 EKG Time: 2201 Rate: 105 Rhythm: Sinus tachycardia QRS Axis: normal Intervals: normal ST/T Wave abnormalities: normal Narrative Interpretation: no evidence of acute ischemia  ____________________________________________  RADIOLOGY  CT abdomen: Findings consistent with pancreatitis  ____________________________________________   PROCEDURES  Procedure(s) performed: No  Procedures  Critical Care performed: No ____________________________________________   INITIAL IMPRESSION / ASSESSMENT AND PLAN / ED COURSE  Pertinent labs & imaging results that were available during my care of the patient were reviewed by me and considered in my medical decision making (see chart for details).  43 year old female with PMH as noted above presents with epigastric pain over the last 2 days, associated with nausea but no vomiting.  The patient reports associated belching and reflux symptoms.  She states that she has had less severe episodes of this pain previously which  resolved on their own.  It has not responded to any of the over-the-counter medications she has taken.  On exam, the patient is uncomfortable but overall relatively well-appearing.  She is significantly hypertensive which I suspect is mostly due to her pain, with otherwise normal vital signs.  She has mild epigastric tenderness on exam.  The remainder of the exam is unremarkable.  Differential includes gastritis, PUD, pancreatitis or other hepatobiliary cause, or less likely colitis, ileus or obstruction.  We will obtain lab work-up and likely CT abdomen and reassess.  ----------------------------------------- 4:24 AM on 07/28/2018 -----------------------------------------  Lab work-up and CT are both consistent with pancreatitis.  The cause is somewhat unclear given that the patient denies any alcohol use and has no evidence of gallstones.  She will need admission for IV hydration and pain control.  The blood pressure also remained significantly elevated even though her pain is better.  I ordered IV labetalol for blood pressure control.  I signed the patient out to the hospitalist Dr. Marcille Blanco for admission.  _________________________________  Armen Pickup was evaluated in  Emergency Department on 07/28/2018 for the symptoms described in the history of present illness. She was evaluated in the context of the global COVID-19 pandemic, which necessitated consideration that the patient might be at risk for infection with the SARS-CoV-2 virus that causes COVID-19. Institutional protocols and algorithms that pertain to the evaluation of patients at risk for COVID-19 are in a state of rapid change based on information released by regulatory bodies including the CDC and federal and state organizations. These policies and algorithms were followed during the patient's care in the ED. ____________________________________________   FINAL CLINICAL IMPRESSION(S) / ED DIAGNOSES  Final diagnoses:  Acute  pancreatitis without infection or necrosis, unspecified pancreatitis type      NEW MEDICATIONS STARTED DURING THIS VISIT:  New Prescriptions   No medications on file     Note:  This document was prepared using Dragon voice recognition software and may include unintentional dictation errors.    Dionne BucySiadecki, Bevan Disney, MD 07/28/18 0425

## 2018-07-27 NOTE — ED Notes (Signed)
Pt to room 15 for upper epigastric pain. Bloodwork, ekg and urine obtained by triage.

## 2018-07-27 NOTE — ED Triage Notes (Signed)
Patient c/o severe epigastric pain, especially after eating.

## 2018-07-27 NOTE — ED Notes (Signed)
Pt states has had this epigastric pain in the past and takes

## 2018-07-27 NOTE — ED Notes (Signed)
Iv team consult placed for difficult stick.

## 2018-07-27 NOTE — ED Notes (Addendum)
Pt states she has taken multiple items today for her abd pain. She report upper abd but points to just above the umbilicus. C/o bloating, not able to eat but small amounts of food, burping and pain. This problem comes and goes but today has found no relief. Took tums, antacid, anti-gas, tylenol and her dm and htn meds today.

## 2018-07-28 ENCOUNTER — Encounter: Payer: Self-pay | Admitting: Internal Medicine

## 2018-07-28 ENCOUNTER — Other Ambulatory Visit: Payer: Self-pay

## 2018-07-28 DIAGNOSIS — J449 Chronic obstructive pulmonary disease, unspecified: Secondary | ICD-10-CM | POA: Diagnosis present

## 2018-07-28 DIAGNOSIS — Z79899 Other long term (current) drug therapy: Secondary | ICD-10-CM | POA: Diagnosis not present

## 2018-07-28 DIAGNOSIS — I160 Hypertensive urgency: Secondary | ICD-10-CM | POA: Diagnosis present

## 2018-07-28 DIAGNOSIS — K853 Drug induced acute pancreatitis without necrosis or infection: Secondary | ICD-10-CM | POA: Diagnosis present

## 2018-07-28 DIAGNOSIS — Z7984 Long term (current) use of oral hypoglycemic drugs: Secondary | ICD-10-CM | POA: Diagnosis not present

## 2018-07-28 DIAGNOSIS — I119 Hypertensive heart disease without heart failure: Secondary | ICD-10-CM | POA: Diagnosis present

## 2018-07-28 DIAGNOSIS — F17210 Nicotine dependence, cigarettes, uncomplicated: Secondary | ICD-10-CM | POA: Diagnosis present

## 2018-07-28 DIAGNOSIS — E119 Type 2 diabetes mellitus without complications: Secondary | ICD-10-CM | POA: Diagnosis present

## 2018-07-28 DIAGNOSIS — Z8249 Family history of ischemic heart disease and other diseases of the circulatory system: Secondary | ICD-10-CM | POA: Diagnosis not present

## 2018-07-28 DIAGNOSIS — Z87898 Personal history of other specified conditions: Secondary | ICD-10-CM | POA: Diagnosis not present

## 2018-07-28 DIAGNOSIS — Z881 Allergy status to other antibiotic agents status: Secondary | ICD-10-CM | POA: Diagnosis not present

## 2018-07-28 DIAGNOSIS — Z833 Family history of diabetes mellitus: Secondary | ICD-10-CM | POA: Diagnosis not present

## 2018-07-28 DIAGNOSIS — Z6841 Body Mass Index (BMI) 40.0 and over, adult: Secondary | ICD-10-CM | POA: Diagnosis not present

## 2018-07-28 DIAGNOSIS — Z716 Tobacco abuse counseling: Secondary | ICD-10-CM | POA: Diagnosis not present

## 2018-07-28 DIAGNOSIS — Z20828 Contact with and (suspected) exposure to other viral communicable diseases: Secondary | ICD-10-CM | POA: Diagnosis present

## 2018-07-28 DIAGNOSIS — R7989 Other specified abnormal findings of blood chemistry: Secondary | ICD-10-CM | POA: Diagnosis present

## 2018-07-28 DIAGNOSIS — K859 Acute pancreatitis without necrosis or infection, unspecified: Secondary | ICD-10-CM | POA: Diagnosis present

## 2018-07-28 LAB — GLUCOSE, CAPILLARY
Glucose-Capillary: 160 mg/dL — ABNORMAL HIGH (ref 70–99)
Glucose-Capillary: 134 mg/dL — ABNORMAL HIGH (ref 70–99)
Glucose-Capillary: 124 mg/dL — ABNORMAL HIGH (ref 70–99)

## 2018-07-28 LAB — LIPASE, BLOOD: Lipase: 118 U/L — ABNORMAL HIGH (ref 11–51)

## 2018-07-28 LAB — TROPONIN I
Troponin I: 0.06 ng/mL (ref <0.03)
Troponin I: 0.04 ng/mL (ref <0.03)
Troponin I: 0.05 ng/mL (ref <0.03)
Troponin I: 0.03 ng/mL (ref <0.03)

## 2018-07-28 LAB — TSH: TSH: 1.319 u[IU]/mL (ref 0.350–4.500)

## 2018-07-28 LAB — LACTATE DEHYDROGENASE: LDH: 156 U/L (ref 98–192)

## 2018-07-28 LAB — SARS CORONAVIRUS 2 BY RT PCR (HOSPITAL ORDER, PERFORMED IN ~~LOC~~ HOSPITAL LAB): SARS Coronavirus 2: NEGATIVE

## 2018-07-28 MED ORDER — ENOXAPARIN SODIUM 40 MG/0.4ML ~~LOC~~ SOLN
40.0000 mg | Freq: Two times a day (BID) | SUBCUTANEOUS | Status: DC
  Administered 2018-07-28 – 2018-07-30 (×4): 40 mg via SUBCUTANEOUS
  Filled 2018-07-28 (×4): qty 0.4

## 2018-07-28 MED ORDER — METOPROLOL TARTRATE 50 MG PO TABS
50.0000 mg | ORAL_TABLET | Freq: Two times a day (BID) | ORAL | Status: DC
  Administered 2018-07-28 – 2018-07-29 (×3): 50 mg via ORAL
  Filled 2018-07-28 (×3): qty 1

## 2018-07-28 MED ORDER — MORPHINE SULFATE (PF) 2 MG/ML IV SOLN
2.0000 mg | INTRAVENOUS | Status: DC | PRN
  Administered 2018-07-28 – 2018-07-29 (×4): 2 mg via INTRAVENOUS
  Filled 2018-07-28 (×4): qty 1

## 2018-07-28 MED ORDER — INSULIN ASPART 100 UNIT/ML ~~LOC~~ SOLN: 0.0000 [IU] | Freq: Every day | SUBCUTANEOUS | Status: DC

## 2018-07-28 MED ORDER — SODIUM CHLORIDE 0.9 % IV SOLN
INTRAVENOUS | Status: DC
  Administered 2018-07-28 – 2018-07-29 (×3): via INTRAVENOUS

## 2018-07-28 MED ORDER — NICOTINE 21 MG/24HR TD PT24
21.0000 mg | MEDICATED_PATCH | Freq: Every day | TRANSDERMAL | Status: DC
  Administered 2018-07-28 – 2018-07-29 (×2): 21 mg via TRANSDERMAL
  Filled 2018-07-28 (×3): qty 1

## 2018-07-28 MED ORDER — ONDANSETRON HCL 4 MG PO TABS: 4.0000 mg | ORAL_TABLET | Freq: Four times a day (QID) | ORAL | Status: DC | PRN

## 2018-07-28 MED ORDER — ACETAMINOPHEN 325 MG PO TABS
650.0000 mg | ORAL_TABLET | Freq: Four times a day (QID) | ORAL | Status: DC | PRN
  Administered 2018-07-29 – 2018-07-30 (×3): 650 mg via ORAL
  Filled 2018-07-28 (×3): qty 2

## 2018-07-28 MED ORDER — MORPHINE SULFATE (PF) 2 MG/ML IV SOLN
2.0000 mg | Freq: Once | INTRAVENOUS | Status: AC
  Administered 2018-07-28: 2 mg via INTRAVENOUS
  Filled 2018-07-28: qty 1

## 2018-07-28 MED ORDER — LABETALOL HCL 5 MG/ML IV SOLN
5.0000 mg | INTRAVENOUS | Status: DC | PRN
  Administered 2018-07-28: 09:00:00 10 mg via INTRAVENOUS
  Filled 2018-07-28: qty 4

## 2018-07-28 MED ORDER — ONDANSETRON HCL 4 MG/2ML IJ SOLN
4.0000 mg | Freq: Four times a day (QID) | INTRAMUSCULAR | Status: DC | PRN
  Administered 2018-07-30: 4 mg via INTRAVENOUS
  Filled 2018-07-28: qty 2

## 2018-07-28 MED ORDER — DOCUSATE SODIUM 100 MG PO CAPS
100.0000 mg | ORAL_CAPSULE | Freq: Two times a day (BID) | ORAL | Status: DC
  Administered 2018-07-28 – 2018-07-30 (×5): 100 mg via ORAL
  Filled 2018-07-28 (×5): qty 1

## 2018-07-28 MED ORDER — NITROGLYCERIN 2 % TD OINT
0.5000 [in_us] | TOPICAL_OINTMENT | Freq: Four times a day (QID) | TRANSDERMAL | Status: DC
  Administered 2018-07-28 – 2018-07-30 (×9): 0.5 [in_us] via TOPICAL
  Filled 2018-07-28 (×9): qty 1

## 2018-07-28 MED ORDER — LABETALOL HCL 5 MG/ML IV SOLN
10.0000 mg | Freq: Once | INTRAVENOUS | Status: AC
  Administered 2018-07-28: 03:00:00 10 mg via INTRAVENOUS
  Filled 2018-07-28: qty 4

## 2018-07-28 MED ORDER — INSULIN ASPART 100 UNIT/ML ~~LOC~~ SOLN
0.0000 [IU] | Freq: Three times a day (TID) | SUBCUTANEOUS | Status: DC
  Administered 2018-07-28: 13:00:00 3 [IU] via SUBCUTANEOUS
  Administered 2018-07-28 – 2018-07-30 (×4): 2 [IU] via SUBCUTANEOUS
  Filled 2018-07-28 (×6): qty 1

## 2018-07-28 MED ORDER — LABETALOL HCL 5 MG/ML IV SOLN
5.0000 mg | INTRAVENOUS | Status: DC | PRN
  Administered 2018-07-28 (×2): 10 mg via INTRAVENOUS
  Filled 2018-07-28 (×2): qty 4

## 2018-07-28 MED ORDER — ACETAMINOPHEN 650 MG RE SUPP: 650.0000 mg | Freq: Four times a day (QID) | RECTAL | Status: DC | PRN

## 2018-07-28 MED ORDER — LABETALOL HCL 5 MG/ML IV SOLN: 5.0000 mg | INTRAVENOUS | Status: DC | PRN

## 2018-07-28 MED ORDER — ENOXAPARIN SODIUM 40 MG/0.4ML ~~LOC~~ SOLN
40.0000 mg | SUBCUTANEOUS | Status: DC
  Administered 2018-07-28: 13:00:00 40 mg via SUBCUTANEOUS
  Filled 2018-07-28: qty 0.4

## 2018-07-28 NOTE — ED Notes (Signed)
ED TO INPATIENT HANDOFF REPORT  ED Nurse Name and Phone #: Ena Dawley [phone]  S Name/Age/Gender Tamara Higgins 43 y.o. female Room/Bed: ED15A/ED15A  Code Status   Code Status: Not on file  Home/SNF/Other Home Patient oriented to: self, place, time and situation Is this baseline? Yes   Triage Complete: Triage complete  Chief Complaint Abd Bloating  Triage Note Patient c/o severe epigastric pain, especially after eating.    Allergies Allergies  Allergen Reactions  . Ciprofloxacin Other (See Comments)    Body stiffens/ tightens     Level of Care/Admitting Diagnosis ED Disposition    ED Disposition Condition [facility] Area: [place] [[id number]]  Level of Care: Med-Surg [16]  Covid Evaluation: Confirmed COVID Negative  Diagnosis: Pancreatitis [202663]  Admitting Physician: Harrie Foreman [[id number]]  Attending Physician: Harrie Foreman [[id number]]  Estimated length of stay: past midnight tomorrow  Certification:: I certify this patient will need inpatient services for at least 2 midnights  PT Class (Do Not Modify): Inpatient [101]  PT Acc Code (Do Not Modify): Private [1]       B Medical/Surgery History Past Medical History:  Diagnosis Date  . Arthritis   . COPD (chronic obstructive pulmonary disease) ([place])   . Diabetes mellitus without complication ([place])   . Hypertension   . Morbid obesity ([place])    Past Surgical History:  Procedure Laterality Date  . CESAREAN SECTION       A IV Location/Drains/Wounds Patient Lines/Drains/Airways Status   Active Line/Drains/Airways    Name:   Placement date:   Placement time:   Site:   Days:   Peripheral IV 07/27/18 Right;Anterior Forearm   07/27/18    2333    Forearm   1          Intake/Output Last 24 hours  Intake/Output Summary (Last 24 hours) at 07/28/2018 0545 Last data filed at 07/28/2018 0346 Gross per 24 hour  Intake 1050 ml  Output -  Net 1050 ml     Labs/Imaging Results for orders placed or performed during the hospital encounter of 07/27/18 (from the past 48 hour(s))  Lipase, blood     Status: Abnormal   Collection Time: 07/27/18 10:04 PM  Result Value Ref Range   Lipase 586 (H) 11 - 51 U/L    Comment: RESULT CONFIRMED BY MANUAL DILUTION JJB Performed at [facility], [address] [place], [place] [address]   Comprehensive metabolic panel     Status: Abnormal   Collection Time: 07/27/18 10:04 PM  Result Value Ref Range   Sodium 138 135 - 145 mmol/L   Potassium 4.0 3.5 - 5.1 mmol/L   Chloride 100 98 - 111 mmol/L   CO2 28 22 - 32 mmol/L   Glucose, Bld 172 (H) 70 - 99 mg/dL   BUN 11 6 - 20 mg/dL   Creatinine, Ser 0.98 0.44 - 1.00 mg/dL   Calcium 8.9 8.9 - 10.3 mg/dL   Total Protein 7.8 6.5 - 8.1 g/dL   Albumin 3.5 3.5 - 5.0 g/dL   AST 14 (L) 15 - 41 U/L   ALT 11 0 - 44 U/L   Alkaline Phosphatase 82 38 - 126 U/L   Total Bilirubin 0.3 0.3 - 1.2 mg/dL   GFR calc non Af Amer >60 >60 mL/min   GFR calc Af Amer >60 >60 mL/min   Anion gap 10 5 - 15    Comment: Performed at [facility], [address] [place], [place] [address]  CBC     Status: Abnormal   Collection Time: 07/27/18 10:04 PM  Result Value Ref Range   WBC 15.8 (H) 4.0 - 10.5 K/uL   RBC 5.04 3.87 - 5.11 MIL/uL   Hemoglobin 14.2 12.0 - 15.0 g/dL   HCT 60.444.1 54.036.0 - 98.146.0 %   MCV 87.5 80.0 - 100.0 fL   MCH 28.2 26.0 - 34.0 pg   MCHC 32.2 30.0 - 36.0 g/dL   RDW 19.114.5 47.811.5 - 29.515.5 %   Platelets 365 150 - 400 K/uL   nRBC 0.0 0.0 - 0.2 %    Comment: Performed at [facility], [address] [place], [place] [address]  Urinalysis, Complete w Microscopic     Status: Abnormal   Collection Time: 07/27/18 10:04 PM  Result Value Ref Range   Color, Urine YELLOW (A) YELLOW   APPearance CLOUDY (A) CLEAR   Specific Gravity, Urine 1.018 1.005 - 1.030   pH 7.0 5.0 - 8.0   Glucose, UA NEGATIVE NEGATIVE mg/dL   Hgb urine dipstick NEGATIVE  NEGATIVE   Bilirubin Urine NEGATIVE NEGATIVE   Ketones, ur NEGATIVE NEGATIVE mg/dL   Protein, ur >=865>=300 (A) NEGATIVE mg/dL   Nitrite NEGATIVE NEGATIVE   Leukocytes,Ua NEGATIVE NEGATIVE   RBC / HPF 0-5 0 - 5 RBC/hpf   WBC, UA 0-5 0 - 5 WBC/hpf   Bacteria, UA NONE SEEN NONE SEEN   Squamous Epithelial / LPF 0-5 0 - 5    Comment: Performed at [facility], [address] [place], [place] [address]  Troponin I - Add-On to previous collection     Status: Abnormal   Collection Time: 07/27/18 10:04 PM  Result Value Ref Range   Troponin I 0.06 (HH) <0.03 ng/mL    Comment: CRITICAL RESULT CALLED TO, READ BACK BY AND VERIFIED WITH SONJA WEAVER AT 0026 ON 07/28/2018 JJB Performed at [facility], [address] [place], [place] [address]   Lactate dehydrogenase     Status: None   Collection Time: 07/28/18 12:54 AM  Result Value Ref Range   LDH 156 98 - 192 U/L    Comment: Performed at [facility], [address] [place], [place] [address]   Ct Abdomen Pelvis W Contrast  Result Date: 07/28/2018 CLINICAL DATA:  Severe epigastric pain.  Concern for pancreatitis EXAM: CT ABDOMEN AND PELVIS WITH CONTRAST TECHNIQUE: Multidetector CT imaging of the abdomen and pelvis was performed using the standard protocol following bolus administration of intravenous contrast. CONTRAST:  150mL OMNIPAQUE IOHEXOL 300 MG/ML  SOLN COMPARISON:  None. FINDINGS: Lower chest: Lung bases are clear. No effusions. Heart is normal size. Hepatobiliary: No focal hepatic abnormality. Gallbladder unremarkable. Pancreas: Inflammation noted around the pancreatic body and tail compatible with acute pancreatitis. Normal enhancement. No ductal dilatation. Spleen: No focal abnormality.  Normal size. Adrenals/Urinary Tract: Lobular contours of the kidneys bilaterally. No focal renal or adrenal mass. No stones or hydronephrosis. Urinary bladder unremarkable. Stomach/Bowel: Stomach, large and small bowel grossly  unremarkable. Normal appendix. Vascular/Lymphatic: No evidence of aneurysm or adenopathy. Reproductive: Uterus and adnexa unremarkable.  No mass. Other: No free fluid or free air. Musculoskeletal: No acute bony abnormality. IMPRESSION: Stranding/inflammation around the pancreatic body and tail compatible with acute pancreatitis. No complicating feature. Electronically Signed   By: Charlett NoseKevin  Dover M.D.   On: 07/28/2018 00:08    Pending Labs [employer] Labs (From admission, onward)    Start     Ordered  07/28/18 0504  SARS Coronavirus 2 (CEPHEID - Performed in [facility] lab), [facility] Order  Once,   STAT    Question:  Rule Out  Answer:  Yes   07/28/18 0503   07/28/18 0502  Troponin I - Now Then Q6H  Now then every 6 hours,   STAT     07/28/18 0501   Signed and Held  Creatinine, serum  (enoxaparin (LOVENOX)    CrCl >/= 30 ml/min)  Weekly,   R    Comments: while on enoxaparin therapy    Signed and Held   Signed and Held  TSH  Add-on,   R     Signed and Held   Signed and Held  Hemoglobin A1c  Add-on,   R     Signed and Held          Vitals/Pain Today's Vitals   07/28/18 0400 07/28/18 0410 07/28/18 0450 07/28/18 0500  BP: (!) 198/102 (!) 190/97 (!) 185/102 (!) 171/91  Pulse: 85 88 87 87  Resp: 16 16 16 16   Temp:      TempSrc:      SpO2: 95% 96% 95% 95%  Weight:      Height:      PainSc: 6   5  Asleep    Isolation Precautions No active isolations  Medications Medications  ondansetron (ZOFRAN) injection 4 mg (has no administration in time range)  labetalol (NORMODYNE) injection 5-10 mg (10 mg Intravenous Given 07/28/18 0456)  nitroGLYCERIN (NITROGLYN) 2 % ointment 0.5 inch (0.5 inches Topical Given 07/28/18 0348)  sodium chloride 0.9 % bolus 1,000 mL (0 mLs Intravenous Stopped 07/28/18 0346)  famotidine (PEPCID) IVPB 20 mg premix (0 mg Intravenous Stopped 07/28/18 0133)  morphine 4 MG/ML injection 4 mg (4 mg Intravenous Given 07/28/18 0059)  iohexol (OMNIPAQUE) 300 MG/ML  solution 125 mL (150 mLs Intravenous Contrast Given 07/27/18 2354)  labetalol (NORMODYNE) injection 10 mg (10 mg Intravenous Given 07/28/18 0257)  morphine 2 MG/ML injection 2 mg (2 mg Intravenous Given 07/28/18 0454)    Mobility walks Low fall risk   Focused Assessments Cardiac Assessment Handoff:    Lab Results  Component Value Date   TROPONINI 0.06 (HH) 07/27/2018   No results found for: DDIMER Does the Patient currently have chest pain? No     R Recommendations: See Admitting Provider Note  Report given to:   Additional Notes: pt with hx of untreated HTN,

## 2018-07-28 NOTE — ED Notes (Signed)
Patient ambulated with stand-by assist to room commode and back to stretcher. Patient appeared short of breath afterwards. Patient was given ED phone to call out to family.

## 2018-07-28 NOTE — Progress Notes (Signed)
Patient's BP 173/100, HR 96. Labetalol order q4 and last given at 0733. Paged Dr. Margaretmary Eddy.   Fuller Mandril, RN

## 2018-07-28 NOTE — Progress Notes (Signed)
PHARMACIST - PHYSICIAN COMMUNICATION  CONCERNING:  Enoxaparin (Lovenox) for DVT Prophylaxis    RECOMMENDATION: Patient was prescribed enoxaprin 40mg  q24 hours for VTE prophylaxis.   Filed Weights   07/27/18 2156 07/27/18 2202  Weight: (!) 375 lb (170.1 kg) (!) 375 lb (170.1 kg)    Body mass index is 68.59 kg/m.  Estimated Creatinine Clearance: 113.4 mL/min (by C-G formula based on SCr of 0.98 mg/dL).   Based on [place] patient is candidate for enoxaparin 40mg  every 12 hour dosing due to BMI being >40.  DESCRIPTION: Pharmacy has adjusted enoxaparin dose per [facility] policy.  Patient is now receiving enoxaparin 40mg  every 12 hours.   Oswald Hillock, PharmD Clinical Pharmacist  07/28/2018 2:28 PM

## 2018-07-28 NOTE — Progress Notes (Signed)
Family Meeting Note  Advance Directive:yes  Today a meeting took place with the Patient.   The following clinical team members were present during this meeting:MD  The following were discussed:Patient's diagnosis: Acute pancreatitis, hypertension, elevated troponin, diabetes mellitus and morbid obesity tobacco abuse disorder and plan of care discussed in detail with the patient.  She verbalized understanding of the plan   patient's progosis: Unable to determine and Goals for treatment: Full Code.  Tamara Higgins Tamara Higgins Tamara Higgins healthcare power of attorney  Additional follow-up to be provided: Hospitalist  Time spent during discussion:17 min  Nicholes Mango, MD

## 2018-07-28 NOTE — ED Notes (Signed)
Dr. Cherylann Banas aware of Troponin of 0.06.

## 2018-07-28 NOTE — Progress Notes (Signed)
Secured patient home medications in hospital pharmacy with signed medication form.   Fuller Mandril, RN

## 2018-07-28 NOTE — Progress Notes (Signed)
Per Dr. Margaretmary Eddy change Labetalol frequency to q2. She will had other PO meds to control high BP.   Fuller Mandril, RN

## 2018-07-28 NOTE — H&P (Addendum)
Tamara Higgins is an 43 y.o. female.   Chief Complaint: Abdominal pain HPI: The patient with past medical history of hypertension and diabetes presents to the emergency department complaining of abdominal pain.  The patient states that her pain has worsened in severity over the last 2 days although she has had brief bouts of epigastric pain on multiple occasions before this.  She admits to nausea but denies vomiting.  She also denies chest pain or shortness of breath.  She admits that she recently started taking lisinopril.  Notably, the patient's blood pressure was greater than 200/100 upon arrival.  She received labetalol and morphine prior to the hospitalist service being called for further management.  Past Medical History:  Diagnosis Date  . Arthritis   . COPD (chronic obstructive pulmonary disease) ([place])   . Diabetes mellitus without complication ([place])   . Hypertension   . Morbid obesity ([place])     Past Surgical History:  Procedure Laterality Date  . CESAREAN SECTION      Family History  Problem Relation Age of Onset  . Diabetes Mellitus II Mother   . Hypertension Mother   . Hypertension Father    Social History:  reports that she has been smoking. She has never used smokeless tobacco. She reports that she does not drink alcohol. No history on file for drug.  Allergies:  Allergies  Allergen Reactions  . Ciprofloxacin Other (See Comments)    Body stiffens/ tightens     Prior to Admission medications   Medication Sig Start Date End Date Taking? Authorizing Provider  acetaminophen (TYLENOL) 650 MG CR tablet Take 650 mg by mouth every 8 (eight) hours as needed for pain.   Yes [provider]  lisinopril (ZESTRIL) 20 MG tablet Take 20 mg by mouth daily.   Yes [provider]  metFORMIN (GLUCOPHAGE-XR) 500 MG 24 hr tablet Take 1,000 mg by mouth daily with breakfast.   Yes [provider]  Simethicone 125 MG CAPS Take 125 mg by mouth every 6 (six) hours  as needed.   Yes [provider]     Results for orders placed or performed during the hospital encounter of 07/27/18 (from the past 48 hour(s))  Lipase, blood     Status: Abnormal   Collection Time: 07/27/18 10:04 PM  Result Value Ref Range   Lipase 586 (H) 11 - 51 U/L    Comment: RESULT CONFIRMED BY MANUAL DILUTION JJB Performed at [facility], [address] [place], [place] [address]   Comprehensive metabolic panel     Status: Abnormal   Collection Time: 07/27/18 10:04 PM  Result Value Ref Range   Sodium 138 135 - 145 mmol/L   Potassium 4.0 3.5 - 5.1 mmol/L   Chloride 100 98 - 111 mmol/L   CO2 28 22 - 32 mmol/L   Glucose, Bld 172 (H) 70 - 99 mg/dL   BUN 11 6 - 20 mg/dL   Creatinine, Ser 0.98 0.44 - 1.00 mg/dL   Calcium 8.9 8.9 - 10.3 mg/dL   Total Protein 7.8 6.5 - 8.1 g/dL   Albumin 3.5 3.5 - 5.0 g/dL   AST 14 (L) 15 - 41 U/L   ALT 11 0 - 44 U/L   Alkaline Phosphatase 82 38 - 126 U/L   Total Bilirubin 0.3 0.3 - 1.2 mg/dL   GFR calc non Af Amer >60 >60 mL/min   GFR calc Af Amer >60 >60 mL/min   Anion gap 10 5 -  15    Comment: Performed at [facility], [address] [place], [place] [address]  CBC     Status: Abnormal   Collection Time: 07/27/18 10:04 PM  Result Value Ref Range   WBC 15.8 (H) 4.0 - 10.5 K/uL   RBC 5.04 3.87 - 5.11 MIL/uL   Hemoglobin 14.2 12.0 - 15.0 g/dL   HCT 60.444.1 54.036.0 - 98.146.0 %   MCV 87.5 80.0 - 100.0 fL   MCH 28.2 26.0 - 34.0 pg   MCHC 32.2 30.0 - 36.0 g/dL   RDW 19.114.5 47.811.5 - 29.515.5 %   Platelets 365 150 - 400 K/uL   nRBC 0.0 0.0 - 0.2 %    Comment: Performed at [facility], [address] [place], [place] [address]  Urinalysis, Complete w Microscopic     Status: Abnormal   Collection Time: 07/27/18 10:04 PM  Result Value Ref Range   Color, Urine YELLOW (A) YELLOW   APPearance CLOUDY (A) CLEAR   Specific Gravity, Urine 1.018 1.005 - 1.030   pH 7.0 5.0 - 8.0   Glucose, UA NEGATIVE NEGATIVE mg/dL    Hgb urine dipstick NEGATIVE NEGATIVE   Bilirubin Urine NEGATIVE NEGATIVE   Ketones, ur NEGATIVE NEGATIVE mg/dL   Protein, ur >=865>=300 (A) NEGATIVE mg/dL   Nitrite NEGATIVE NEGATIVE   Leukocytes,Ua NEGATIVE NEGATIVE   RBC / HPF 0-5 0 - 5 RBC/hpf   WBC, UA 0-5 0 - 5 WBC/hpf   Bacteria, UA NONE SEEN NONE SEEN   Squamous Epithelial / LPF 0-5 0 - 5    Comment: Performed at [facility], [address] [place], [place] [address]  Troponin I - Add-On to previous collection     Status: Abnormal   Collection Time: 07/27/18 10:04 PM  Result Value Ref Range   Troponin I 0.06 (HH) <0.03 ng/mL    Comment: CRITICAL RESULT CALLED TO, READ BACK BY AND VERIFIED WITH SONJA WEAVER AT 0026 ON 07/28/2018 JJB Performed at [facility], [address] [place], [place] [address]   Lactate dehydrogenase     Status: None   Collection Time: 07/28/18 12:54 AM  Result Value Ref Range   LDH 156 98 - 192 U/L    Comment: Performed at [facility], [address] [place], [place] [address]   Ct Abdomen Pelvis W Contrast  Result Date: 07/28/2018 CLINICAL DATA:  Severe epigastric pain.  Concern for pancreatitis EXAM: CT ABDOMEN AND PELVIS WITH CONTRAST TECHNIQUE: Multidetector CT imaging of the abdomen and pelvis was performed using the standard protocol following bolus administration of intravenous contrast. CONTRAST:  150mL OMNIPAQUE IOHEXOL 300 MG/ML  SOLN COMPARISON:  None. FINDINGS: Lower chest: Lung bases are clear. No effusions. Heart is normal size. Hepatobiliary: No focal hepatic abnormality. Gallbladder unremarkable. Pancreas: Inflammation noted around the pancreatic body and tail compatible with acute pancreatitis. Normal enhancement. No ductal dilatation. Spleen: No focal abnormality.  Normal size. Adrenals/Urinary Tract: Lobular contours of the kidneys bilaterally. No focal renal or adrenal mass. No stones or hydronephrosis. Urinary bladder unremarkable. Stomach/Bowel: Stomach,  large and small bowel grossly unremarkable. Normal appendix. Vascular/Lymphatic: No evidence of aneurysm or adenopathy. Reproductive: Uterus and adnexa unremarkable.  No mass. Other: No free fluid or free air. Musculoskeletal: No acute bony abnormality. IMPRESSION: Stranding/inflammation around the pancreatic body and tail compatible with acute pancreatitis. No complicating feature. Electronically Signed   By: Charlett NoseKevin  Dover M.D.   On: 07/28/2018 00:08    Review of Systems  Constitutional:  Negative for chills and fever.  HENT: Negative for sore throat and tinnitus.   Eyes: Negative for blurred vision and redness.  Respiratory: Negative for cough and shortness of breath.   Cardiovascular: Negative for chest pain, palpitations, orthopnea and PND.  Gastrointestinal: Positive for abdominal pain and nausea. Negative for diarrhea and vomiting.  Genitourinary: Negative for dysuria, frequency and urgency.  Musculoskeletal: Negative for joint pain and myalgias.  Skin: Negative for rash.       No lesions  Neurological: Negative for speech change, focal weakness and weakness.  Endo/Heme/Allergies: Does not bruise/bleed easily.       No temperature intolerance  Psychiatric/Behavioral: Negative for depression and suicidal ideas.    Blood pressure (!) 185/102, pulse 87, temperature 98.3 F (36.8 C), temperature source Oral, resp. rate 16, height 5\' 2"  (1.575 m), weight (!) 170.1 kg, SpO2 95 %, unknown if currently breastfeeding. Physical Exam  Vitals reviewed. Constitutional: She is oriented to person, place, and time. She appears well-developed and well-nourished. No distress.  HENT:  Head: Normocephalic and atraumatic.  Mouth/Throat: Oropharynx is clear and moist.  Eyes: Pupils are equal, round, and reactive to light. Conjunctivae and EOM are normal. No scleral icterus.  Neck: Normal range of motion. Neck supple. No JVD present. No tracheal deviation present. No thyromegaly present.  Cardiovascular:  Normal rate, regular rhythm and normal heart sounds. Exam reveals no gallop and no friction rub.  No murmur heard. Respiratory: Effort normal and breath sounds normal.  GI: Soft. Bowel sounds are normal. She exhibits no distension and no mass. There is abdominal tenderness. There is no rebound and no guarding.  Genitourinary:    Genitourinary Comments: Deferred   Musculoskeletal: Normal range of motion.        General: No edema.  Lymphadenopathy:    She has no cervical adenopathy.  Neurological: She is alert and oriented to person, place, and time. No cranial nerve deficit. She exhibits normal muscle tone.  Skin: Skin is warm and dry. No rash noted. No erythema.  Psychiatric: She has a normal mood and affect. Her behavior is normal. Judgment and thought content normal.     Assessment/Plan This is a 43 year old female admitted for pancreatitis. 1.  Pancreatitis: Elevated lipase and stranding seen on CT scan.  Gallbladder is unremarkable and liver enzymes normal.  No alcohol intake or indication of gallstones.  I suspect the patient's ACE inhibitor or metformin may be causing pancreatic inflammation.  I have held both medications.  Continue bowel rest.  Hydrate with intravenous fluid.  Manage severe pain with IV morphine. 2.  Hypertension: Uncontrolled; labetalol as needed.  I have applied Nitropaste to the patient's chest.  We may need to schedule hydralazine as well. 3.  Elevated troponin: Likely secondary to demand ischemia due to prolonged uncontrolled hypertension.  Continue to follow cardiac enzymes.  EKG without signs of acute ischemia. 4.  Diabetes mellitus type 2: Hold metformin.  Sliding scale insulin while hospitalized 5.  Morbid obesity: BMI is 68.5; encouraged healthy diet and exercise 6.  DVT prophylaxis: Lovenox 7.  GI prophylaxis: Pantoprazole The patient is a full code.  Time spent on admission orders and patient care approximately 45 minutes  Arnaldo Nataliamond,  Reagen Haberman S,  MD 07/28/2018, 5:00 AM

## 2018-07-28 NOTE — ED Notes (Signed)
Attempted report. Misty RN reports she is going to call MD gouro and speak about possible change in bed/floor due to patients high blood pressure needing a higher level of care

## 2018-07-28 NOTE — Progress Notes (Signed)
[facility] [place] at [facility]   PATIENT NAME: Tamara Higgins    MR#:  [id number]  DATE OF BIRTH:  02-Apr-1975  SUBJECTIVE:  CHIEF COMPLAINT:    REVIEW OF SYSTEMS:  CONSTITUTIONAL: No fever, fatigue or weakness.  EYES: No blurred or double vision.  EARS, NOSE, AND THROAT: No tinnitus or ear pain.  RESPIRATORY: No cough, shortness of breath, wheezing or hemoptysis.  CARDIOVASCULAR: No chest pain, orthopnea, edema.  GASTROINTESTINAL: No nausea, vomiting, diarrhea or abdominal pain.  GENITOURINARY: No dysuria, hematuria.  ENDOCRINE: No polyuria, nocturia,  HEMATOLOGY: No anemia, easy bruising or bleeding SKIN: No rash or lesion. MUSCULOSKELETAL: No joint pain or arthritis.   NEUROLOGIC: No tingling, numbness, weakness.  PSYCHIATRY: No anxiety or depression.   DRUG ALLERGIES:   Allergies  Allergen Reactions  . Ciprofloxacin Other (See Comments)    Body stiffens/ tightens     VITALS:  Blood pressure (!) 161/80, pulse 75, temperature 98.2 F (36.8 C), temperature source Oral, resp. rate 17, height 5\' 2"  (1.575 m), weight (!) 170.1 kg, SpO2 95 %, unknown if currently breastfeeding.  PHYSICAL EXAMINATION:  GENERAL:  43 y.o.-year-old patient lying in the bed with no acute distress.  EYES: Pupils equal, round, reactive to light and accommodation. No scleral icterus. Extraocular muscles intact.  HEENT: Head atraumatic, normocephalic. Oropharynx and nasopharynx clear.  NECK:  Supple, no jugular venous distention. No thyroid enlargement, no tenderness.  LUNGS: Normal breath sounds bilaterally, no wheezing, rales,rhonchi or crepitation. No use of accessory muscles of respiration.  CARDIOVASCULAR: S1, S2 normal. No murmurs, rubs, or gallops.  ABDOMEN: Soft, nontender, nondistended. Bowel sounds present. No organomegaly or mass.  EXTREMITIES: No pedal edema, cyanosis, or clubbing.  NEUROLOGIC: Cranial nerves II through XII are intact. Muscle strength 5/5 in  all extremities. Sensation intact. Gait not checked.  PSYCHIATRIC: The patient is alert and oriented x 3.  SKIN: No obvious rash, lesion, or ulcer.    LABORATORY PANEL:   CBC Recent Labs  Lab 07/27/18 2204  WBC 15.8*  HGB 14.2  HCT 44.1  PLT 365   ------------------------------------------------------------------------------------------------------------------  Chemistries  Recent Labs  Lab 07/27/18 2204  NA 138  K 4.0  CL 100  CO2 28  GLUCOSE 172*  BUN 11  CREATININE 0.98  CALCIUM 8.9  AST 14*  ALT 11  ALKPHOS 82  BILITOT 0.3   ------------------------------------------------------------------------------------------------------------------  Cardiac Enzymes Recent Labs  Lab 07/28/18 1059  TROPONINI 0.05*   ------------------------------------------------------------------------------------------------------------------  RADIOLOGY:  Ct Abdomen Pelvis W Contrast  Result Date: 07/28/2018 CLINICAL DATA:  Severe epigastric pain.  Concern for pancreatitis EXAM: CT ABDOMEN AND PELVIS WITH CONTRAST TECHNIQUE: Multidetector CT imaging of the abdomen and pelvis was performed using the standard protocol following bolus administration of intravenous contrast. CONTRAST:  150mL OMNIPAQUE IOHEXOL 300 MG/ML  SOLN COMPARISON:  None. FINDINGS: Lower chest: Lung bases are clear. No effusions. Heart is normal size. Hepatobiliary: No focal hepatic abnormality. Gallbladder unremarkable. Pancreas: Inflammation noted around the pancreatic body and tail compatible with acute pancreatitis. Normal enhancement. No ductal dilatation. Spleen: No focal abnormality.  Normal size. Adrenals/Urinary Tract: Lobular contours of the kidneys bilaterally. No focal renal or adrenal mass. No stones or hydronephrosis. Urinary bladder unremarkable. Stomach/Bowel: Stomach, large and small bowel grossly unremarkable. Normal appendix. Vascular/Lymphatic: No evidence of aneurysm or adenopathy. Reproductive: Uterus  and adnexa unremarkable.  No mass. Other: No free fluid or free air. Musculoskeletal: No acute bony abnormality. IMPRESSION: Stranding/inflammation around the pancreatic body and tail compatible  with acute pancreatitis. No complicating feature. Electronically Signed   By: Rolm Baptise M.D.   On: 07/28/2018 00:08    EKG:   Orders placed or performed during the hospital encounter of 07/27/18  . EKG 12-Lead  . EKG 12-Lead  . ED EKG  . ED EKG    ASSESSMENT AND PLAN:   This is a 43 year old female admitted for pancreatitis.  1.  Acute Pancreatitis: Could be drug-induced from ACE inhibitor or metformin CT abdomen and pelvis with acute pancreatitis Monitor lipase level currently at 586  elevated lipase and stranding seen on CT scan.   Gallbladder is unremarkable and liver enzymes normal.   No alcohol intake or indication of gallstones.  Check fasting lipid panel  I suspect the patient's ACE inhibitor or metformin may be causing pancreatic inflammation.  I have held both medications.   Continue bowel rest.  Hydrate with intravenous fluid.   Manage severe pain with IV morphine as needed TSH is normal  2.  Hypertension: Uncontrolled; labetalol as needed.  Patient's home medication ACE inhibitor is on hold.  Metoprolol 50 mg p.o. twice daily is added to the regimen will titrate as needed  3.  Elevated troponin: Likely secondary to demand ischemia due to prolonged uncontrolled hypertension.  Continue to follow cardiac enzymes.  EKG without signs of acute ischemia.  4.  Diabetes mellitus type 2: Hold metformin.  Sliding scale insulin while hospitalized  5.  Morbid obesity: BMI is 68.5; encouraged healthy diet and exercise  6.  Tobacco abuse disorder counseled patient to quit smoking for 5 minutes.  She verbalized understanding of the plan.  Agreeable with nicotine patch  DVT prophylaxis: Lovenox     All the records are reviewed and case discussed with Care Management/Social  Workerr. Management plans discussed with the patient, she is  in agreement.  CODE STATUS: fc   TOTAL TIME TAKING CARE OF THIS PATIENT: 36  minutes.   POSSIBLE D/C IN 2 DAYS, DEPENDING ON CLINICAL CONDITION.  Note: This dictation was prepared with Dragon dictation along with smaller phrase technology. Any transcriptional errors that result from this process are unintentional.   Nicholes Mango M.D on 07/28/2018 at 2:30 PM  Between 7am to 6pm - Pager - [phone] After 6pm go to [email or web address] - password [email or web address] [place] [facility]  Office  [phone]  CC: Primary care physician; Patient, No Pcp Per

## 2018-07-28 NOTE — ED Notes (Signed)
Near target BP but can't call report d/t COVID not collected

## 2018-07-28 NOTE — ED Notes (Signed)
Discussion with 2A charge, Museum/gallery conservator, regarding pt appro

## 2018-07-29 LAB — COMPREHENSIVE METABOLIC PANEL
ALT: 12 U/L (ref 0–44)
AST: 12 U/L — ABNORMAL LOW (ref 15–41)
Albumin: 3 g/dL — ABNORMAL LOW (ref 3.5–5.0)
Alkaline Phosphatase: 68 U/L (ref 38–126)
Anion gap: 8 (ref 5–15)
BUN: 10 mg/dL (ref 6–20)
CO2: 25 mmol/L (ref 22–32)
Calcium: 8.3 mg/dL — ABNORMAL LOW (ref 8.9–10.3)
Chloride: 106 mmol/L (ref 98–111)
Creatinine, Ser: 0.91 mg/dL (ref 0.44–1.00)
GFR calc Af Amer: >60 mL/min (ref >60)
GFR calc non Af Amer: >60 mL/min (ref >60)
Glucose, Bld: 133 mg/dL — ABNORMAL HIGH (ref 70–99)
Potassium: 4.1 mmol/L (ref 3.5–5.1)
Sodium: 139 mmol/L (ref 135–145)
Total Bilirubin: 0.3 mg/dL (ref 0.3–1.2)
Total Protein: 6.6 g/dL (ref 6.5–8.1)

## 2018-07-29 LAB — LIPID PANEL
Cholesterol: 158 mg/dL (ref 0–200)
HDL: 29 mg/dL — ABNORMAL LOW (ref >40)
LDL Cholesterol: 92 mg/dL (ref 0–99)
Total CHOL/HDL Ratio: 5.4 RATIO
Triglycerides: 184 mg/dL — ABNORMAL HIGH (ref <150)
VLDL: 37 mg/dL (ref 0–40)

## 2018-07-29 LAB — GLUCOSE, CAPILLARY
Glucose-Capillary: 132 mg/dL — ABNORMAL HIGH (ref 70–99)
Glucose-Capillary: 127 mg/dL — ABNORMAL HIGH (ref 70–99)
Glucose-Capillary: 126 mg/dL — ABNORMAL HIGH (ref 70–99)
Glucose-Capillary: 155 mg/dL — ABNORMAL HIGH (ref 70–99)

## 2018-07-29 LAB — HEMOGLOBIN A1C
Hgb A1c MFr Bld: 7.9 % — ABNORMAL HIGH (ref 4.8–5.6)
Mean Plasma Glucose: 180.03 mg/dL

## 2018-07-29 LAB — LIPASE, BLOOD: Lipase: 90 U/L — ABNORMAL HIGH (ref 11–51)

## 2018-07-29 LAB — CBC
HCT: 37.8 % (ref 36.0–46.0)
Hemoglobin: 11.9 g/dL — ABNORMAL LOW (ref 12.0–15.0)
MCH: 27.4 pg (ref 26.0–34.0)
MCHC: 31.5 g/dL (ref 30.0–36.0)
MCV: 87.1 fL (ref 80.0–100.0)
Platelets: 313 10*3/uL (ref 150–400)
RBC: 4.34 MIL/uL (ref 3.87–5.11)
RDW: 14.6 % (ref 11.5–15.5)
WBC: 10.6 10*3/uL — ABNORMAL HIGH (ref 4.0–10.5)
nRBC: 0 % (ref 0.0–0.2)

## 2018-07-29 MED ORDER — METOPROLOL TARTRATE 50 MG PO TABS: 50.0000 mg | ORAL_TABLET | Freq: Once | ORAL | Status: DC

## 2018-07-29 MED ORDER — METOPROLOL TARTRATE 50 MG PO TABS
100.0000 mg | ORAL_TABLET | Freq: Two times a day (BID) | ORAL | Status: DC
  Administered 2018-07-29 – 2018-07-30 (×2): 100 mg via ORAL
  Filled 2018-07-29 (×2): qty 2

## 2018-07-29 MED ORDER — LABETALOL HCL 5 MG/ML IV SOLN
5.0000 mg | INTRAVENOUS | Status: DC | PRN
  Administered 2018-07-29: 18:00:00 10 mg via INTRAVENOUS
  Administered 2018-07-29: 05:00:00 5 mg via INTRAVENOUS
  Administered 2018-07-29 – 2018-07-30 (×2): 10 mg via INTRAVENOUS
  Filled 2018-07-29 (×4): qty 4

## 2018-07-29 NOTE — Progress Notes (Signed)
Dr.Diamond notified for elevated BP; PRN order for SBP>170 AND P>110; acknowledged; new order written/modified. Barbaraann Faster, RN 4:18 AM 07/29/2018

## 2018-07-29 NOTE — Progress Notes (Signed)
Per Dr. Margaretmary Eddy change diet to clear liquids and modify NS rate to 75.   Fuller Mandril, RN

## 2018-07-29 NOTE — Progress Notes (Addendum)
[facility] - [place] at [facility]   PATIENT NAME: Tamara Higgins    MR#:  [id number]  DATE OF BIRTH:  1976-02-06  SUBJECTIVE:  CHIEF COMPLAINT: Reporting epigastric abdominal pain 3 out of 10   REVIEW OF SYSTEMS:  CONSTITUTIONAL: No fever, fatigue or weakness.  EYES: No blurred or double vision.  EARS, NOSE, AND THROAT: No tinnitus or ear pain.  RESPIRATORY: No cough, shortness of breath, wheezing or hemoptysis.  CARDIOVASCULAR: No chest pain, orthopnea, edema.  GASTROINTESTINAL: Reports nausea, denies vomiting, diarrhea.  Endorses epigastric abdominal pain.  GENITOURINARY: No dysuria, hematuria.  ENDOCRINE: No polyuria, nocturia,  HEMATOLOGY: No anemia, easy bruising or bleeding SKIN: No rash or lesion. MUSCULOSKELETAL: No joint pain or arthritis.   NEUROLOGIC: No tingling, numbness, weakness.  PSYCHIATRY: No anxiety or depression.   DRUG ALLERGIES:   Allergies  Allergen Reactions  . Ciprofloxacin Other (See Comments)    Body stiffens/ tightens     VITALS:  Blood pressure (!) 181/95, pulse 76, temperature 98.4 F (36.9 C), temperature source Oral, resp. rate 18, height 5\' 2"  (1.575 m), weight (!) 170.8 kg, SpO2 100 %, unknown if currently breastfeeding.  PHYSICAL EXAMINATION:  GENERAL:  43 y.o.-year-old patient lying in the bed with no acute distress.  EYES: Pupils equal, round, reactive to light and accommodation. No scleral icterus. Extraocular muscles intact.  HEENT: Head atraumatic, normocephalic. Oropharynx and nasopharynx clear.  NECK:  Supple, no jugular venous distention. No thyroid enlargement, no tenderness.  LUNGS: Normal breath sounds bilaterally, no wheezing, rales,rhonchi or crepitation. No use of accessory muscles of respiration.  CARDIOVASCULAR: S1, S2 normal. No murmurs, rubs, or gallops.  ABDOMEN: Soft, epigastric tenderness is present no rebound tenderness nondistended. Bowel sounds present. EXTREMITIES: No pedal edema,  cyanosis, or clubbing.  NEUROLOGIC: Cranial nerves II through XII are intact. Muscle strength 5/5 in all extremities. Sensation intact. Gait not checked.  PSYCHIATRIC: The patient is alert and oriented x 3.  SKIN: No obvious rash, lesion, or ulcer.    LABORATORY PANEL:   CBC Recent Labs  Lab 07/29/18 0400  WBC 10.6*  HGB 11.9*  HCT 37.8  PLT 313   ------------------------------------------------------------------------------------------------------------------  Chemistries  Recent Labs  Lab 07/29/18 0400  NA 139  K 4.1  CL 106  CO2 25  GLUCOSE 133*  BUN 10  CREATININE 0.91  CALCIUM 8.3*  AST 12*  ALT 12  ALKPHOS 68  BILITOT 0.3   ------------------------------------------------------------------------------------------------------------------  Cardiac Enzymes Recent Labs  Lab 07/28/18 1719  TROPONINI 0.03*   ------------------------------------------------------------------------------------------------------------------  RADIOLOGY:  Ct Abdomen Pelvis W Contrast  Result Date: 07/28/2018 CLINICAL DATA:  Severe epigastric pain.  Concern for pancreatitis EXAM: CT ABDOMEN AND PELVIS WITH CONTRAST TECHNIQUE: Multidetector CT imaging of the abdomen and pelvis was performed using the standard protocol following bolus administration of intravenous contrast. CONTRAST:  150mL OMNIPAQUE IOHEXOL 300 MG/ML  SOLN COMPARISON:  None. FINDINGS: Lower chest: Lung bases are clear. No effusions. Heart is normal size. Hepatobiliary: No focal hepatic abnormality. Gallbladder unremarkable. Pancreas: Inflammation noted around the pancreatic body and tail compatible with acute pancreatitis. Normal enhancement. No ductal dilatation. Spleen: No focal abnormality.  Normal size. Adrenals/Urinary Tract: Lobular contours of the kidneys bilaterally. No focal renal or adrenal mass. No stones or hydronephrosis. Urinary bladder unremarkable. Stomach/Bowel: Stomach, large and small bowel grossly  unremarkable. Normal appendix. Vascular/Lymphatic: No evidence of aneurysm or adenopathy. Reproductive: Uterus and adnexa unremarkable.  No mass. Other: No free fluid or free air. Musculoskeletal: No acute  bony abnormality. IMPRESSION: Stranding/inflammation around the pancreatic body and tail compatible with acute pancreatitis. No complicating feature. Electronically Signed   By: Rolm Baptise M.D.   On: 07/28/2018 00:08    EKG:   Orders placed or performed during the hospital encounter of 07/27/18  . EKG 12-Lead  . EKG 12-Lead  . ED EKG  . ED EKG    ASSESSMENT AND PLAN:   This is a 43 year old female admitted for pancreatitis.  1.  Acute Pancreatitis: Could be drug-induced from ACE inhibitor or metformin CT abdomen and pelvis with acute pancreatitis Monitor lipase level currently at 586- 90   elevated lipase and stranding seen on CT scan.   Gallbladder is unremarkable and liver enzymes normal.   No alcohol intake or indication of gallstones.  Triglycerides at 184  I suspect the patient's ACE inhibitor or metformin may be causing pancreatic inflammation.  I have discontinued both medications.  Clinically feeling better started clear liquid diet and advance as tolerated  Manage severe pain with IV morphine as needed TSH is normal  2.  Hypertension: Uncontrolled; labetalol as needed.  Patient's home medication ACE inhibitor is on hold.  Metoprolol 50 mg p.o. twice daily is added to the regimen, dose increased 100 mg p.o. twice daily  3.  Elevated troponin: Likely secondary to demand ischemia due to prolonged uncontrolled hypertension.  troponins 0.04-0.05-0.03   EKG without signs of acute ischemia.  4.  Diabetes mellitus type 2: dced  metformin.  Sliding scale insulin while hospitalized.  Sugars are stable at this time.  Will consider glipizide at the time of discharge  5.  Morbid obesity: BMI is 68.5; encouraged healthy diet and exercise  6.  Tobacco abuse disorder counseled  patient to quit smoking for 5 minutes.  She verbalized understanding of the plan.  Agreeable with nicotine patch  DVT prophylaxis: Lovenox     All the records are reviewed and case discussed with Care Management/Social Workerr. Management plans discussed with the patient, she is  in agreement.  CODE STATUS: fc   TOTAL TIME TAKING CARE OF THIS PATIENT: 36  minutes.   POSSIBLE D/C IN 1-2 DAYS, DEPENDING ON CLINICAL CONDITION.  Note: This dictation was prepared with Dragon dictation along with smaller phrase technology. Any transcriptional errors that result from this process are unintentional.   Nicholes Mango M.D on 07/29/2018 at 3:34 PM  Between 7am to 6pm - Pager - [phone] After 6pm go to [email or web address] - password [email or web address] [place] [facility]  Office  [phone]  CC: Primary care physician; Patient, No Pcp Per

## 2018-07-30 ENCOUNTER — Inpatient Hospital Stay: Payer: Medicaid Other

## 2018-07-30 LAB — GLUCOSE, CAPILLARY
Glucose-Capillary: 149 mg/dL — ABNORMAL HIGH (ref 70–99)
Glucose-Capillary: 150 mg/dL — ABNORMAL HIGH (ref 70–99)

## 2018-07-30 LAB — LIPASE, BLOOD: Lipase: 65 U/L — ABNORMAL HIGH (ref 11–51)

## 2018-07-30 MED ORDER — ONDANSETRON HCL 4 MG PO TABS: 4.0000 mg | ORAL_TABLET | Freq: Three times a day (TID) | ORAL | 0 refills | Status: AC | PRN

## 2018-07-30 MED ORDER — GLIPIZIDE 5 MG PO TABS: 5.0000 mg | ORAL_TABLET | Freq: Two times a day (BID) | ORAL | 0 refills | Status: DC

## 2018-07-30 MED ORDER — GLIPIZIDE 5 MG PO TABS
2.5000 mg | ORAL_TABLET | Freq: Every day | ORAL | Status: DC
  Administered 2018-07-30: 13:00:00 2.5 mg via ORAL
  Filled 2018-07-30: qty 0.5

## 2018-07-30 MED ORDER — METOPROLOL TARTRATE 100 MG PO TABS: 100.0000 mg | ORAL_TABLET | Freq: Two times a day (BID) | ORAL | 0 refills | Status: DC

## 2018-07-30 MED ORDER — AMLODIPINE BESYLATE 10 MG PO TABS: 10.0000 mg | ORAL_TABLET | Freq: Every day | ORAL | 0 refills | Status: AC

## 2018-07-30 MED ORDER — ALBUTEROL SULFATE (2.5 MG/3ML) 0.083% IN NEBU
2.5000 mg | INHALATION_SOLUTION | RESPIRATORY_TRACT | Status: DC | PRN
  Administered 2018-07-30: 05:00:00 2.5 mg via RESPIRATORY_TRACT
  Filled 2018-07-30: qty 3

## 2018-07-30 MED ORDER — HYDRALAZINE HCL 20 MG/ML IJ SOLN
10.0000 mg | INTRAMUSCULAR | Status: DC | PRN
  Administered 2018-07-30: 02:00:00 10 mg via INTRAVENOUS
  Filled 2018-07-30: qty 1

## 2018-07-30 MED ORDER — HYDRALAZINE HCL 20 MG/ML IJ SOLN
20.0000 mg | INTRAMUSCULAR | Status: DC | PRN
  Administered 2018-07-30 (×2): 20 mg via INTRAVENOUS
  Filled 2018-07-30 (×2): qty 1

## 2018-07-30 MED ORDER — VERAPAMIL HCL 2.5 MG/ML IV SOLN
5.0000 mg | Freq: Once | INTRAVENOUS | Status: AC
  Administered 2018-07-30: 5 mg via INTRAVENOUS
  Filled 2018-07-30: qty 2

## 2018-07-30 MED ORDER — NICOTINE 21 MG/24HR TD PT24: 21.0000 mg | MEDICATED_PATCH | Freq: Every day | TRANSDERMAL | 0 refills | Status: DC

## 2018-07-30 MED ORDER — AMLODIPINE BESYLATE 10 MG PO TABS
10.0000 mg | ORAL_TABLET | Freq: Every day | ORAL | Status: DC
  Administered 2018-07-30: 03:00:00 10 mg via ORAL
  Filled 2018-07-30: qty 1

## 2018-07-30 NOTE — Discharge Summary (Addendum)
[place] [facility] [place] NAME: Tamara Higgins    MR#:  [id number]  DATE OF BIRTH:  04/03/1975  DATE OF ADMISSION:  07/27/2018 ADMITTING PHYSICIAN: Harrie Foreman, MD   DATE OF DISCHARGE: 07/30/2018 PRIMARY CARE PHYSICIAN: Patient, No Pcp Per    ADMISSION DIAGNOSIS:  Acute pancreatitis without infection or necrosis, unspecified pancreatitis type [K85.90]  DISCHARGE DIAGNOSIS:  Acute pancreatitis drug-induced Malignant hypertension  SECONDARY DIAGNOSIS:   Past Medical History:  Diagnosis Date  . Arthritis   . COPD (chronic obstructive pulmonary disease) ([place])   . Diabetes mellitus without complication ([place])   . Hypertension   . Morbid obesity [facility])     HOSPITAL COURSE:   HPI: The patient with past medical history of hypertension and diabetes presents to the emergency department complaining of abdominal pain.  The patient states that her pain has worsened in severity over the last 2 days although she has had brief bouts of epigastric pain on multiple occasions before this.  She admits to nausea but denies vomiting.  She also denies chest pain or shortness of breath.  She admits that she recently started taking lisinopril.  Notably, the patient's blood pressure was greater than 200/100 upon arrival.  She received labetalol and morphine prior to the hospitalist service being called for further management.  1.  AcutePancreatitis: Could be drug-induced from ACE inhibitor or metformin CT abdomen and pelvis with acute pancreatitis Monitor lipase level currently at 586- 90   elevated lipase and stranding seen on CT scan.  Gallbladder is unremarkable and liver enzymes normal. No alcohol intake or indication of gallstones.  Triglycerides at 184 I suspect the patient's ACE inhibitor or metformin may be causing pancreatic inflammation. I have discontinued both medications.  Clinically feeling better started clear liquid diet and  advance as tolerated Manage severe pain with IV morphine as needed TSH is normal  2. Hypertension: Uncontrolled; labetalol as needed.  Patient's home medication ACE inhibitor is on hold.  Metoprolol 50 mg p.o. twice daily is added to the regimen, dose increased 45 mg p.o. twice daily  3. Elevated troponin: Likely secondary to demand ischemia due to prolonged uncontrolled hypertension.  troponins 0.04-0.05-0.03  EKG without signs of acute ischemia.  4. Diabetes mellitus type 2: dced  metformin. Sliding scale insulin while hospitalized.  Sugars are stable at this time.  Will consider glipizide at the time of discharge  5. Morbid obesity: BMI is 68.5; encouraged healthy diet and exercise  6.  Tobacco abuse disorder counseled patient to quit smoking for 5 minutes.  She verbalized understanding of the plan.  Agreeable with nicotine patch  DVT prophylaxis: Lovenox   DISCHARGE CONDITIONS:   Stable  CONSULTS OBTAINED:     PROCEDURES none  DRUG ALLERGIES:   Allergies  Allergen Reactions  . Ciprofloxacin Other (See Comments)    Body stiffens/ tightens     DISCHARGE MEDICATIONS:   Allergies as of 07/30/2018      Reactions   Ciprofloxacin Other (See Comments)   Body stiffens/ tightens       Medication List    STOP taking these medications   lisinopril 20 MG tablet Commonly known as: ZESTRIL   metFORMIN 500 MG 24 hr tablet Commonly known as: GLUCOPHAGE-XR     TAKE these medications   acetaminophen 650 MG CR tablet Commonly known as: TYLENOL Take 650 mg by mouth every 8 (eight) hours as needed for pain. Notes to patient: Last dose given today  at 1255pm   amLODipine 10 MG tablet Commonly known as: NORVASC Take 1 tablet (10 mg total) by mouth daily. Start taking on: July 31, 2018   glipiZIDE 5 MG tablet Commonly known as: GLUCOTROL Take 1 tablet (5 mg total) by mouth 2 (two) times daily before a meal.   metoprolol tartrate 100 MG tablet Commonly  known as: LOPRESSOR Take 1 tablet (100 mg total) by mouth 2 (two) times daily.   nicotine 21 mg/24hr patch Commonly known as: NICODERM CQ - dosed in mg/24 hours Place 1 patch (21 mg total) onto the skin daily. Start taking on: July 31, 2018   ondansetron 4 MG tablet Commonly known as: Zofran Take 1 tablet (4 mg total) by mouth every 8 (eight) hours as needed for nausea or vomiting.   Simethicone 125 MG Caps Take 125 mg by mouth every 6 (six) hours as needed.        DISCHARGE INSTRUCTIONS:  Follow-up with primary care physician in 2 to 3 days    DIET:  Cardiac diet and Diabetic diet  DISCHARGE CONDITION:  Fair  ACTIVITY:  Activity as tolerated  OXYGEN:  Home Oxygen: No.   Oxygen Delivery: room air  DISCHARGE LOCATION:  home   If you experience worsening of your admission symptoms, develop shortness of breath, life threatening emergency, suicidal or homicidal thoughts you must seek medical attention immediately by calling 911 or calling your MD immediately  if symptoms less severe.  You Must read complete instructions/literature along with all the possible adverse reactions/side effects for all the Medicines you take and that have been prescribed to you. Take any new Medicines after you have completely understood and accpet all the possible adverse reactions/side effects.   Please note  You were cared for by a hospitalist during your hospital stay. If you have any questions about your discharge medications or the care you received while you were in the hospital after you are discharged, you can call the unit and asked to speak with the hospitalist on call if the hospitalist that took care of you is not available. Once you are discharged, your primary care physician will handle any further medical issues. Please note that NO REFILLS for any discharge medications will be authorized once you are discharged, as it is imperative that you return to your primary care physician  (or establish a relationship with a primary care physician if you do not have one) for your aftercare needs so that they can reassess your need for medications and monitor your lab values.     Today  Chief Complaint  Patient presents with  . Abdominal Pain   Patient is feeling better.  Tolerating soft diet and she wants to go home.  Denies any abdominal pain nausea or vomiting today  ROS:  CONSTITUTIONAL: Denies fevers, chills. Denies any fatigue, weakness.  EYES: Denies blurry vision, double vision, eye pain. EARS, NOSE, THROAT: Denies tinnitus, ear pain, hearing loss. RESPIRATORY: Denies cough, wheeze, shortness of breath.  CARDIOVASCULAR: Denies chest pain, palpitations, edema.  GASTROINTESTINAL: Denies nausea, vomiting, diarrhea, abdominal pain. Denies bright red blood per rectum. GENITOURINARY: Denies dysuria, hematuria. ENDOCRINE: Denies nocturia or thyroid problems. HEMATOLOGIC AND LYMPHATIC: Denies easy bruising or bleeding. SKIN: Denies rash or lesion. MUSCULOSKELETAL: Denies pain in neck, back, shoulder, knees, hips or arthritic symptoms.  NEUROLOGIC: Denies paralysis, paresthesias.  PSYCHIATRIC: Denies anxiety or depressive symptoms.   VITAL SIGNS:  Blood pressure (!) 156/86, pulse 75, temperature 98 F (36.7 C), temperature source Oral,  resp. rate 20, height  (1.575 m), weight (!) 170.8 kg, SpO2 100 %, unknown if currently breastfeeding.  I/O:    Intake/Output Summary (Last 24 hours) at 07/30/2018 1505 Last data filed at 07/30/2018 1350 Gross per 24 hour  Intake 1571.91 ml  Output -  Net 1571.91 ml    PHYSICAL EXAMINATION:  GENERAL:  43 y.o.-year-old patient lying in the bed with no acute distress.  EYES: Pupils equal, round, reactive to light and accommodation. No scleral icterus. Extraocular muscles intact.  HEENT: Head atraumatic, normocephalic. Oropharynx and nasopharynx clear.  NECK:  Supple, no jugular venous distention. No thyroid enlargement, no  tenderness.  LUNGS: Normal breath sounds bilaterally, no wheezing, rales,rhonchi or crepitation. No use of accessory muscles of respiration.  CARDIOVASCULAR: S1, S2 normal. No murmurs, rubs, or gallops.  ABDOMEN: Soft, non-tender, non-distended. Bowel sounds present.  EXTREMITIES: No pedal edema, cyanosis, or clubbing.  NEUROLOGIC: Cranial nerves II through XII are intact. Muscle strength 5/5 in all extremities. Sensation intact. Gait not checked.  PSYCHIATRIC: The patient is alert and oriented x 3.  SKIN: No obvious rash, lesion, or ulcer.   DATA REVIEW:   CBC Recent Labs  Lab 07/29/18 0400  WBC 10.6*  HGB 11.9*  HCT 37.8  PLT 313    Chemistries  Recent Labs  Lab 07/29/18 0400  NA 139  K 4.1  CL 106  CO2 25  GLUCOSE 133*  BUN 10  CREATININE 0.91  CALCIUM 8.3*  AST 12*  ALT 12  ALKPHOS 68  BILITOT 0.3    Cardiac Enzymes Recent Labs  Lab 07/28/18 1719  TROPONINI 0.03*    Microbiology Results  Results for orders placed or performed during the hospital encounter of 07/27/18  SARS Coronavirus 2 (CEPHEID - Performed in [facility] hospital lab), Hosp Order     Status: None   Collection Time: 07/28/18  5:08 AM   Specimen: Nasopharyngeal Swab  Result Value Ref Range Status   SARS Coronavirus 2 NEGATIVE NEGATIVE Final    Comment: (NOTE) If result is NEGATIVE SARS-CoV-2 target nucleic acids are NOT DETECTED. The SARS-CoV-2 RNA is generally detectable in upper and lower  respiratory specimens during the acute phase of infection. The lowest  concentration of SARS-CoV-2 viral copies this assay can detect is 250  copies / mL. A negative result does not preclude SARS-CoV-2 infection  and should not be used as the sole basis for treatment or other  patient management decisions.  A negative result may occur with  improper specimen collection / handling, submission of specimen other  than nasopharyngeal swab, presence of viral mutation(s) within the  areas targeted by  this assay, and inadequate number of viral copies  (<250 copies / mL). A negative result must be combined with clinical  observations, patient history, and epidemiological information. If result is POSITIVE SARS-CoV-2 target nucleic acids are DETECTED. The SARS-CoV-2 RNA is generally detectable in upper and lower  respiratory specimens dur ing the acute phase of infection.  Positive  results are indicative of active infection with SARS-CoV-2.  Clinical  correlation with patient history and other diagnostic information is  necessary to determine patient infection status.  Positive results do  not rule out bacterial infection or co-infection with other viruses. If result is PRESUMPTIVE POSTIVE SARS-CoV-2 nucleic acids MAY BE PRESENT.   A presumptive positive result was obtained on the submitted specimen  and confirmed on repeat testing.  While 2019 novel coronavirus  (SARS-CoV-2) nucleic acids may be present in  the submitted sample  additional confirmatory testing may be necessary for epidemiological  and / or clinical management purposes  to differentiate between  SARS-CoV-2 and other Sarbecovirus currently known to infect humans.  If clinically indicated additional testing with an alternate test  methodology [phone] is advised. The SARS-CoV-2 RNA is generally  detectable in upper and lower respiratory sp ecimens during the acute  phase of infection. The expected result is Negative. Fact Sheet for Patients:  BoilerBrush.com.cyhttps://www.fda.gov/media/136312/download Fact Sheet for Healthcare Providers: https://pope.com/https://www.fda.gov/media/136313/download This test is not yet approved or cleared by the [place] FDA and has been authorized for detection and/or diagnosis of SARS-CoV-2 by FDA under an Emergency Use Authorization (EUA).  This EUA will remain in effect (meaning this test can be used) for the duration of the COVID-19 declaration under Section 564(b)(1) of the Act, 21 U.S.C. section  360bbb-3(b)(1), unless the authorization is terminated or revoked sooner. Performed at [facility], [address] [place], [place] [address]     RADIOLOGY:  Ct Abdomen Pelvis W Contrast  Result Date: 07/28/2018 CLINICAL DATA:  Severe epigastric pain.  Concern for pancreatitis EXAM: CT ABDOMEN AND PELVIS WITH CONTRAST TECHNIQUE: Multidetector CT imaging of the abdomen and pelvis was performed using the standard protocol following bolus administration of intravenous contrast. CONTRAST:  150mL OMNIPAQUE IOHEXOL 300 MG/ML  SOLN COMPARISON:  None. FINDINGS: Lower chest: Lung bases are clear. No effusions. Heart is normal size. Hepatobiliary: No focal hepatic abnormality. Gallbladder unremarkable. Pancreas: Inflammation noted around the pancreatic body and tail compatible with acute pancreatitis. Normal enhancement. No ductal dilatation. Spleen: No focal abnormality.  Normal size. Adrenals/Urinary Tract: Lobular contours of the kidneys bilaterally. No focal renal or adrenal mass. No stones or hydronephrosis. Urinary bladder unremarkable. Stomach/Bowel: Stomach, large and small bowel grossly unremarkable. Normal appendix. Vascular/Lymphatic: No evidence of aneurysm or adenopathy. Reproductive: Uterus and adnexa unremarkable.  No mass. Other: No free fluid or free air. Musculoskeletal: No acute bony abnormality. IMPRESSION: Stranding/inflammation around the pancreatic body and tail compatible with acute pancreatitis. No complicating feature. Electronically Signed   By: Charlett NoseKevin  Dover M.D.   On: 07/28/2018 00:08   Dg Chest Port 1 View  Result Date: 07/30/2018 CLINICAL DATA:  Cough. EXAM: PORTABLE CHEST 1 VIEW COMPARISON:  Two-view chest x-ray 05/16/2016 FINDINGS: The heart is enlarged. This is exaggerated by low lung volumes. There is no edema or effusion no focal airspace consolidation is present. IMPRESSION: 1. Stable cardiomegaly without failure. 2. Low lung volumes. Electronically Signed   By:  Marin Robertshristopher  Mattern M.D.   On: 07/30/2018 05:36    EKG:   Orders placed or performed during the hospital encounter of 07/27/18  . EKG 12-Lead  . EKG 12-Lead  . ED EKG  . ED EKG  . EKG      Management plans discussed with the patient, she is in agreement.  CODE STATUS:     Code Status Orders  (From admission, onward)         Start     Ordered   07/28/18 0902  Full code  Continuous     07/28/18 0901        Code Status History    This patient has a current code status but no historical code status.   Advance Care Planning Activity      TOTAL TIME TAKING CARE OF THIS PATIENT: 45 minutes.   Note: This dictation was prepared with Dragon dictation along with smaller phrase technology. Any transcriptional errors that result from this  process are unintentional.   @[facility] @  on 07/30/2018 at 3:05 PM  Between 7am to 6pm - Pager - [phone]  After 6pm go to [email or web address] - password [email or web address]  Fabio [facility]  Office  [phone]  CC: Primary care physician; Patient, No Pcp Per

## 2018-07-30 NOTE — Discharge Instructions (Signed)
Follow-up with primary care physician in 2 to 3 days ° °

## 2018-07-30 NOTE — Progress Notes (Signed)
Notified dr.diamond of pt c/o SOB. o2 at 100% on room air. Lungs clear, respiratory seen pt at my request. Acknowledged and ordered chest xray and neb treatments. Cont to monitor.

## 2018-07-30 NOTE — TOC Initial Note (Signed)
Transition of Care [facility]) - Initial/Assessment Note    Patient Details  Name: Tamara Higgins MRN: [id number] Date of Birth: [birth year]  Transition of Care [facility]) CM/SW Contact:    Beverly Sessions, RN Phone Number: 07/30/2018, 4:03 PM  Clinical Narrative:                 Patient to discharge home today.   States that she will be obtaining her medication from [facility] drug. Patient states that she will be using goodrx and declines for me to print any coupons  Patient states that she has previously been with [facility] primary in [place], and has left them a message to to set up a new patient appointment, and declines for me to make an appointment   Patient states that she lives at home with her dad.  Denies any issues with transportation or obtaining her medication  Expected Discharge Plan: Home/Self Care     Patient Goals and CMS Choice        Expected Discharge Plan and Services Expected Discharge Plan: Home/Self Care   Discharge Planning Services: CM Consult, Medication Assistance     Expected Discharge Date: 07/30/18                                    Prior Living Arrangements/Services                       Activities of Daily Living Home Assistive Devices/Equipment: None ADL Screening (condition at time of admission) Patient's cognitive ability adequate to safely complete daily activities?: Yes Is the patient deaf or have difficulty hearing?: No Does the patient have difficulty seeing, even when wearing glasses/contacts?: No Does the patient have difficulty concentrating, remembering, or making decisions?: No Patient able to express need for assistance with ADLs?: Yes Does the patient have difficulty dressing or bathing?: No Independently performs ADLs?: Yes (appropriate for developmental age) Does the patient have difficulty walking or climbing stairs?: No Weakness of Legs: None Weakness of Arms/Hands: None  Permission Sought/Granted                   Emotional Assessment              Admission diagnosis:  Acute pancreatitis without infection or necrosis, unspecified pancreatitis type [K85.90] Patient Active Problem List   Diagnosis Date Noted  . Pancreatitis 07/28/2018   PCP:  Patient, No Pcp Per Pharmacy:   [facility] [place] [facility] [address] [place] [address] Phone: [phone] Fax: [phone]     Social Determinants of Health (SDOH) Interventions    Readmission Risk Interventions No flowsheet data found.

## 2018-07-30 NOTE — Progress Notes (Signed)
Notified dr.willis of pt cont increasing b/p after Prn meds. Acknowledged and new orders placed. Cont to monitor.

## 2018-07-30 NOTE — Progress Notes (Signed)
Patient given PRN breathing treatment for shortness of breath. Patient states she is feeling better at this time. Will continue to monitor.

## 2018-07-30 NOTE — Progress Notes (Signed)
RN called RT to patient bedside for assessment. Patient woke up complaining of shortness of breath. Patient is currently on room air, SAT at 97%, clear diminished bilateral breath sounds, able to speak in full sentences and does not appear to be in distress. Patient did state she has an Albuterol inhaler that she uses at home, however, I did not see any home respiratory meds listed. Patient is now resting comfortably in bed. No distress noted. Will continue to monitor.

## 2019-03-12 DIAGNOSIS — M1710 Unilateral primary osteoarthritis, unspecified knee: Secondary | ICD-10-CM | POA: Insufficient documentation

## 2019-10-17 DIAGNOSIS — N939 Abnormal uterine and vaginal bleeding, unspecified: Secondary | ICD-10-CM | POA: Insufficient documentation

## 2019-10-17 DIAGNOSIS — R87810 Cervical high risk human papillomavirus (HPV) DNA test positive: Secondary | ICD-10-CM | POA: Insufficient documentation

## 2020-04-13 ENCOUNTER — Emergency Department: Payer: BC Managed Care – PPO

## 2020-04-13 ENCOUNTER — Inpatient Hospital Stay
Admission: EM | Admit: 2020-04-13 | Discharge: 2020-04-18 | DRG: 439 | Disposition: A | Discharge status: Home / Self Care | Payer: BC Managed Care – PPO | Attending: Internal Medicine | Admitting: Internal Medicine

## 2020-04-13 ENCOUNTER — Other Ambulatory Visit: Payer: Self-pay

## 2020-04-13 DIAGNOSIS — E876 Hypokalemia: Secondary | ICD-10-CM | POA: Diagnosis present

## 2020-04-13 DIAGNOSIS — N179 Acute kidney failure, unspecified: Secondary | ICD-10-CM | POA: Diagnosis present

## 2020-04-13 DIAGNOSIS — K861 Other chronic pancreatitis: Secondary | ICD-10-CM | POA: Diagnosis present

## 2020-04-13 DIAGNOSIS — K859 Acute pancreatitis without necrosis or infection, unspecified: Secondary | ICD-10-CM | POA: Diagnosis not present

## 2020-04-13 DIAGNOSIS — E860 Dehydration: Secondary | ICD-10-CM | POA: Diagnosis present

## 2020-04-13 DIAGNOSIS — I10 Essential (primary) hypertension: Secondary | ICD-10-CM | POA: Diagnosis present

## 2020-04-13 DIAGNOSIS — Z20822 Contact with and (suspected) exposure to covid-19: Secondary | ICD-10-CM | POA: Diagnosis present

## 2020-04-13 DIAGNOSIS — J449 Chronic obstructive pulmonary disease, unspecified: Secondary | ICD-10-CM | POA: Diagnosis present

## 2020-04-13 DIAGNOSIS — Z79899 Other long term (current) drug therapy: Secondary | ICD-10-CM

## 2020-04-13 DIAGNOSIS — R768 Other specified abnormal immunological findings in serum: Secondary | ICD-10-CM

## 2020-04-13 DIAGNOSIS — Z6841 Body Mass Index (BMI) 40.0 and over, adult: Secondary | ICD-10-CM

## 2020-04-13 DIAGNOSIS — Z7984 Long term (current) use of oral hypoglycemic drugs: Secondary | ICD-10-CM

## 2020-04-13 DIAGNOSIS — E119 Type 2 diabetes mellitus without complications: Secondary | ICD-10-CM | POA: Diagnosis present

## 2020-04-13 DIAGNOSIS — Z833 Family history of diabetes mellitus: Secondary | ICD-10-CM

## 2020-04-13 DIAGNOSIS — F5089 Other specified eating disorder: Secondary | ICD-10-CM | POA: Diagnosis present

## 2020-04-13 DIAGNOSIS — E118 Type 2 diabetes mellitus with unspecified complications: Secondary | ICD-10-CM

## 2020-04-13 DIAGNOSIS — K5900 Constipation, unspecified: Secondary | ICD-10-CM | POA: Diagnosis present

## 2020-04-13 DIAGNOSIS — F17200 Nicotine dependence, unspecified, uncomplicated: Secondary | ICD-10-CM | POA: Diagnosis present

## 2020-04-13 DIAGNOSIS — R109 Unspecified abdominal pain: Secondary | ICD-10-CM

## 2020-04-13 LAB — CBC
HCT: 46.1 % — ABNORMAL HIGH (ref 36.0–46.0)
Hemoglobin: 15.5 g/dL — ABNORMAL HIGH (ref 12.0–15.0)
MCH: 28.9 pg (ref 26.0–34.0)
MCHC: 33.6 g/dL (ref 30.0–36.0)
MCV: 85.8 fL (ref 80.0–100.0)
Platelets: 422 10*3/uL — ABNORMAL HIGH (ref 150–400)
RBC: 5.37 MIL/uL — ABNORMAL HIGH (ref 3.87–5.11)
RDW: 15.3 % (ref 11.5–15.5)
WBC: 12.1 10*3/uL — ABNORMAL HIGH (ref 4.0–10.5)
nRBC: 0 % (ref 0.0–0.2)

## 2020-04-13 LAB — COMPREHENSIVE METABOLIC PANEL
ALT: 21 U/L (ref 0–44)
AST: 29 U/L (ref 15–41)
Albumin: 3.5 g/dL (ref 3.5–5.0)
Alkaline Phosphatase: 58 U/L (ref 38–126)
Anion gap: 9 (ref 5–15)
BUN: 11 mg/dL (ref 6–20)
CO2: 25 mmol/L (ref 22–32)
Calcium: 8.9 mg/dL (ref 8.9–10.3)
Chloride: 97 mmol/L — ABNORMAL LOW (ref 98–111)
Creatinine, Ser: 1.22 mg/dL — ABNORMAL HIGH (ref 0.44–1.00)
GFR, Estimated: 55 mL/min — ABNORMAL LOW (ref >60)
Glucose, Bld: 191 mg/dL — ABNORMAL HIGH (ref 70–99)
Potassium: 3.3 mmol/L — ABNORMAL LOW (ref 3.5–5.1)
Sodium: 131 mmol/L — ABNORMAL LOW (ref 135–145)
Total Bilirubin: 0.7 mg/dL (ref 0.3–1.2)
Total Protein: 7.6 g/dL (ref 6.5–8.1)

## 2020-04-13 LAB — URINALYSIS, COMPLETE (UACMP) WITH MICROSCOPIC
Bilirubin Urine: NEGATIVE
Glucose, UA: NEGATIVE mg/dL
Ketones, ur: NEGATIVE mg/dL
Leukocytes,Ua: NEGATIVE
Nitrite: NEGATIVE
Protein, ur: >=300 mg/dL — AB
Specific Gravity, Urine: 1.028 (ref 1.005–1.030)
pH: 5 (ref 5.0–8.0)

## 2020-04-13 LAB — RESP PANEL BY RT-PCR (FLU A&B, COVID) ARPGX2
Influenza A by PCR: NEGATIVE
Influenza B by PCR: NEGATIVE
SARS Coronavirus 2 by RT PCR: NEGATIVE

## 2020-04-13 LAB — POC URINE PREG, ED: Preg Test, Ur: NEGATIVE

## 2020-04-13 LAB — LIPASE, BLOOD: Lipase: 97 U/L — ABNORMAL HIGH (ref 11–51)

## 2020-04-13 LAB — MAGNESIUM: Magnesium: 1.9 mg/dL (ref 1.7–2.4)

## 2020-04-13 MED ORDER — ENOXAPARIN SODIUM 40 MG/0.4ML ~~LOC~~ SOLN: 40.0000 mg | SUBCUTANEOUS | Status: DC

## 2020-04-13 MED ORDER — INSULIN ASPART 100 UNIT/ML ~~LOC~~ SOLN
0.0000 [IU] | Freq: Three times a day (TID) | SUBCUTANEOUS | Status: DC
  Administered 2020-04-14: 3 [IU] via SUBCUTANEOUS
  Administered 2020-04-14: 5 [IU] via SUBCUTANEOUS
  Administered 2020-04-14 – 2020-04-15 (×2): 3 [IU] via SUBCUTANEOUS
  Administered 2020-04-15: 5 [IU] via SUBCUTANEOUS
  Administered 2020-04-15 – 2020-04-16 (×4): 3 [IU] via SUBCUTANEOUS
  Administered 2020-04-17 (×2): 5 [IU] via SUBCUTANEOUS
  Administered 2020-04-17 – 2020-04-18 (×2): 3 [IU] via SUBCUTANEOUS
  Filled 2020-04-13 (×13): qty 1

## 2020-04-13 MED ORDER — AMLODIPINE BESYLATE 10 MG PO TABS
10.0000 mg | ORAL_TABLET | Freq: Every day | ORAL | Status: DC
  Administered 2020-04-14 – 2020-04-18 (×5): 10 mg via ORAL
  Filled 2020-04-13: qty 1
  Filled 2020-04-13: qty 2
  Filled 2020-04-13 (×3): qty 1

## 2020-04-13 MED ORDER — INSULIN GLARGINE 100 UNIT/ML ~~LOC~~ SOLN
5.0000 [IU] | Freq: Every day | SUBCUTANEOUS | Status: DC
  Administered 2020-04-14 – 2020-04-17 (×5): 5 [IU] via SUBCUTANEOUS
  Filled 2020-04-13 (×6): qty 0.05

## 2020-04-13 MED ORDER — POTASSIUM CHLORIDE 10 MEQ/100ML IV SOLN
10.0000 meq | INTRAVENOUS | Status: AC
  Administered 2020-04-13 (×2): 10 meq via INTRAVENOUS
  Filled 2020-04-13 (×2): qty 100

## 2020-04-13 MED ORDER — HYDROMORPHONE HCL 1 MG/ML IJ SOLN
0.5000 mg | INTRAMUSCULAR | Status: DC | PRN
Start: 2020-04-13 — End: 2020-04-18
  Administered 2020-04-14 – 2020-04-18 (×14): 0.5 mg via INTRAVENOUS
  Filled 2020-04-13: qty 1
  Filled 2020-04-13 (×2): qty 0.5
  Filled 2020-04-13: qty 1
  Filled 2020-04-13 (×7): qty 0.5
  Filled 2020-04-13: qty 1
  Filled 2020-04-13 (×2): qty 0.5

## 2020-04-13 MED ORDER — HYDROMORPHONE HCL 1 MG/ML IJ SOLN
0.5000 mg | Freq: Once | INTRAMUSCULAR | Status: AC
Start: 2020-04-13 — End: 2020-04-13
  Administered 2020-04-13: 0.5 mg via INTRAVENOUS
  Filled 2020-04-13 (×2): qty 1

## 2020-04-13 MED ORDER — ACETAMINOPHEN 650 MG RE SUPP: 650.0000 mg | Freq: Four times a day (QID) | RECTAL | Status: DC | PRN

## 2020-04-13 MED ORDER — SODIUM CHLORIDE 0.9% FLUSH
3.0000 mL | Freq: Two times a day (BID) | INTRAVENOUS | Status: DC
  Administered 2020-04-14 – 2020-04-17 (×6): 3 mL via INTRAVENOUS

## 2020-04-13 MED ORDER — SODIUM CHLORIDE 0.9 % IV BOLUS
1000.0000 mL | Freq: Once | INTRAVENOUS | Status: AC
  Administered 2020-04-13: 1000 mL via INTRAVENOUS

## 2020-04-13 MED ORDER — LACTATED RINGERS IV SOLN: INTRAVENOUS | Status: AC

## 2020-04-13 MED ORDER — ONDANSETRON HCL 4 MG/2ML IJ SOLN
4.0000 mg | Freq: Once | INTRAMUSCULAR | Status: AC
  Administered 2020-04-13: 4 mg via INTRAVENOUS
  Filled 2020-04-13: qty 2

## 2020-04-13 MED ORDER — ACETAMINOPHEN 325 MG PO TABS
650.0000 mg | ORAL_TABLET | Freq: Four times a day (QID) | ORAL | Status: DC | PRN
  Administered 2020-04-14 – 2020-04-15 (×2): 650 mg via ORAL
  Filled 2020-04-13 (×2): qty 2

## 2020-04-13 MED ORDER — METOPROLOL TARTRATE 50 MG PO TABS
100.0000 mg | ORAL_TABLET | Freq: Two times a day (BID) | ORAL | Status: DC
  Administered 2020-04-13 – 2020-04-18 (×10): 100 mg via ORAL
  Filled 2020-04-13 (×10): qty 2

## 2020-04-13 NOTE — ED Notes (Signed)
Zac, RN, at bedside looking with ultrasound for IV placement.

## 2020-04-13 NOTE — ED Notes (Signed)
IV team reports the oncoming RN has PICC lines needed prior to be able to come down. Dr. Fuller Plan notified of delay.

## 2020-04-13 NOTE — ED Notes (Signed)
Dr. Fuller Plan able to place IV via ultrasounds, however still requesting new IV via IV team for more dependable access.

## 2020-04-13 NOTE — ED Notes (Signed)
EDP at bedside evaluating pt.  

## 2020-04-13 NOTE — ED Notes (Signed)
Ultrasound IV not able to be placed by third nurse, will place IV team consult.

## 2020-04-13 NOTE — ED Notes (Signed)
Called lab to send more covid swab PCR  tubes.

## 2020-04-13 NOTE — H&P (Signed)
History and Physical   Tamara Higgins EHO:[id number] DOB: 1975/09/27 DOA: 04/13/2020  PCP: Patient, No Pcp Per   Patient coming from: Home  Chief Complaint: Abdominal pain  HPI: LORRI FUKUHARA is a 45 y.o. female with medical history significant of pancreatitis, arthritis, COPD, diabetes, hypertension, obesity who presents with ongoing abdominal pain.  Patient states she has had abdominal pain for about 10 days.  She was seen at [facility] ED on the 26th and diagnosed with pancreatitis and had a CT scan there which showed minimal stranding.  She also had a bedside ultrasound there which did not show gallstones.  She was given some IV fluids and discharged with oral pain medication as the pancreatitis appeared mild.  She states that she has had persistent pain and the pain medication has not done very much for her and only lasted for 2 hours or so at a time so she did not want to take too much of it.  She states the pain is actually getting worse and she has only been able to keep down water and apple sauce.  She denies fever, chills, chest pain, shortness of breath, constipation, diarrhea, nausea, vomiting.  ED Course: Vital signs in ED were stable.  CMP showed sodium 131 which corrects to 132 for glucose of 191, potassium 3.3, chloride 97, creatinine 1.22 up from a baseline of 0.9.  CBC showed leukocytosis to 12.1 and hemoglobin of 15.5 as well as platelets elevated to 422.  Magnesium normal.  Lipase is somewhat higher than her previous at 97.  Respiratory panel for flu and Covid negative.  Urinalysis showed hemoglobin, protein which is stable, rare bacteria and some white blood cells.  Right upper quadrant ultrasound showed 2.9 cm hypoechoic mass within the right liver lobe likely hemangioma with recommendation for nonemergent outpatient follow-up.  Patient was given Zofran 1 L bolus, Dilaudid, potassium in the ED.  Review of Systems: As per HPI otherwise all other systems reviewed and are  negative.  Past Medical History:  Diagnosis Date  . Arthritis   . COPD (chronic obstructive pulmonary disease) (HCC)   . Diabetes mellitus without complication (HCC)   . Hypertension   . Morbid obesity (HCC)     Past Surgical History:  Procedure Laterality Date  . CESAREAN SECTION      Social History  reports that she has been smoking. She has never used smokeless tobacco. She reports that she does not drink alcohol. No history on file for drug use.  Allergies  Allergen Reactions  . Ciprofloxacin Other (See Comments)    Body stiffens/ tightens     Family History  Problem Relation Age of Onset  . Diabetes Mellitus II Mother   . Hypertension Mother   . Hypertension Father   Reviewed on Admission  Prior to Admission medications   Medication Sig Start Date End Date Taking? Authorizing Provider  acetaminophen (TYLENOL) 650 MG CR tablet Take 650 mg by mouth every 8 (eight) hours as needed for pain.    [provider]  amLODipine (NORVASC) 10 MG tablet Take 1 tablet (10 mg total) by mouth daily. 07/31/18   Gouru, Deanna Artis, MD  glipiZIDE (GLUCOTROL) 5 MG tablet Take 1 tablet (5 mg total) by mouth 2 (two) times daily before a meal. 07/30/18   Gouru, Deanna Artis, MD  metoprolol tartrate (LOPRESSOR) 100 MG tablet Take 1 tablet (100 mg total) by mouth 2 (two) times daily. 07/30/18   Ramonita Lab, MD  nicotine (NICODERM CQ - DOSED IN  MG/24 HOURS) 21 mg/24hr patch Place 1 patch (21 mg total) onto the skin daily. 07/31/18   Ramonita Lab, MD  Simethicone 125 MG CAPS Take 125 mg by mouth every 6 (six) hours as needed.    [provider]    Physical Exam: Vitals:   04/13/20 1812 04/13/20 1900 04/13/20 2000 04/13/20 2100  BP: (!) 145/91 (!) 141/82 124/75 126/75  Pulse: 75 63 (!) 55 65  Resp: 16 (!) 22 19 16   Temp:      TempSrc:      SpO2: 97% 100% 92% 93%   Physical Exam Constitutional:      General: She is not in acute distress.    Appearance: Normal appearance. She is  obese.  HENT:     Head: Normocephalic and atraumatic.     Mouth/Throat:     Mouth: Mucous membranes are moist.     Pharynx: Oropharynx is clear.  Eyes:     Extraocular Movements: Extraocular movements intact.     Pupils: Pupils are equal, round, and reactive to light.  Cardiovascular:     Rate and Rhythm: Normal rate and regular rhythm.     Pulses: Normal pulses.     Heart sounds: Normal heart sounds.  Pulmonary:     Effort: Pulmonary effort is normal. No respiratory distress.     Breath sounds: Normal breath sounds.  Abdominal:     General: Bowel sounds are normal. There is no distension.     Palpations: Abdomen is soft.     Tenderness: There is abdominal tenderness.  Musculoskeletal:        General: No swelling or deformity.  Skin:    General: Skin is warm and dry.  Neurological:     General: No focal deficit present.     Mental Status: Mental status is at baseline.    Labs on Admission: I have personally reviewed following labs and imaging studies  CBC: Recent Labs  Lab 04/13/20 1512  WBC 12.1*  HGB 15.5*  HCT 46.1*  MCV 85.8  PLT 422*    Basic Metabolic Panel: Recent Labs  Lab 04/13/20 1512  NA 131*  K 3.3*  CL 97*  CO2 25  GLUCOSE 191*  BUN 11  CREATININE 1.22*  CALCIUM 8.9  MG 1.9   GFR: CrCl cannot be calculated (Unknown ideal weight.).  Liver Function Tests: Recent Labs  Lab 04/13/20 1512  AST 29  ALT 21  ALKPHOS 58  BILITOT 0.7  PROT 7.6  ALBUMIN 3.5    Urine analysis:    Component Value Date/Time   COLORURINE YELLOW (A) 04/13/2020 1512   APPEARANCEUR CLOUDY (A) 04/13/2020 1512   LABSPEC 1.028 04/13/2020 1512   PHURINE 5.0 04/13/2020 1512   GLUCOSEU NEGATIVE 04/13/2020 1512   HGBUR LARGE (A) 04/13/2020 1512   BILIRUBINUR NEGATIVE 04/13/2020 1512   KETONESUR NEGATIVE 04/13/2020 1512   PROTEINUR >=300 (A) 04/13/2020 1512   NITRITE NEGATIVE 04/13/2020 1512   LEUKOCYTESUR NEGATIVE 04/13/2020 1512    Radiological Exams on  Admission: 06/13/2020 ABDOMEN LIMITED RUQ (LIVER/GB)  Result Date: 04/13/2020 CLINICAL DATA:  Abdominal pain for 10 days EXAM: ULTRASOUND ABDOMEN LIMITED RIGHT UPPER QUADRANT COMPARISON:  07/27/2018 FINDINGS: Gallbladder: No gallstones or wall thickening visualized. No sonographic Murphy sign noted by sonographer. Common bile duct: Diameter: 4 mm Liver: 2.9 x 1.7 x 2.6 cm hypoechoic structure within the right lobe liver is indeterminate, likely reflecting hemangioma. The remainder of the liver parenchyma is unremarkable. No biliary duct dilation. Portal vein is  patent on color Doppler imaging with normal direction of blood flow towards the liver. Other: None. IMPRESSION: 1. 2.9 cm hyperechoic mass within the right lobe liver, likely a hemangioma. Nonemergent follow-up dedicated liver MRI could be performed for confirmation. 2. Otherwise unremarkable right upper quadrant ultrasound. No evidence of cholelithiasis or cholecystitis. Electronically Signed   By: Sharlet Salina M.D.   On: 04/13/2020 19:20   EKG: Not yet obtained  Assessment/Plan Principal Problem:   Pancreatitis Active Problems:   COPD (chronic obstructive pulmonary disease) (HCC)   Diabetes (HCC)   Hypertension  Pancreatitis > Diagnosed on the 26th at [facility] ED and has failed outpatient treatment. > CT at [facility] ED visit on 2/26 did show fat stranding about the pancreas so not repeated here.  Ultrasound here and did not show any gallstones. - Monitor on telemetry and MedSurg - N.p.o., advance as tolerated to clear liquid diet - As needed Dilaudid - IV fluids - Trend CBC  AKI Hypokalemia > Creatinine elevated to 1.22 from baseline of 0.9 constituting a greater than 0.3 change.  Potassium 3.3 in ED with normal magnesium. > Did receive 20 mEq IV potassium in ED as well as 1 L bolus. - Continue IV fluids at 125 cc/h - Avoid nephrotoxic agents - Trend renal function and electrolytes  COPD - Continue home albuterol  Diabetes > On 10 units  nightly at home, started recently. Has been taking and Glucose was 191 in ED. - 5 units nightly - SSI  Hypertension - Continue home metoprolol and amlodipine  DVT prophylaxis: Lovenox  Code Status:   Full  Family Communication:  None on admission  Disposition Plan:   Patient is from:  Home  Anticipated DC to:  Home  Anticipated DC date:  1 to 2 days  Anticipated DC barriers: None  Consults called:  None  Admission status:  Observation, MedSurg with telemetry   Severity of Illness: The appropriate patient status for this patient is OBSERVATION. Observation status is judged to be reasonable and necessary in order to provide the required intensity of service to ensure the patient's safety. The patient's presenting symptoms, physical exam findings, and initial radiographic and laboratory data in the context of their medical condition is felt to place them at decreased risk for further clinical deterioration. Furthermore, it is anticipated that the patient will be medically stable for discharge from the hospital within 2 midnights of admission. The following factors support the patient status of observation.   " The patient's presenting symptoms include abdominal pain, poor p.o. intake. " The physical exam findings include epigastric abdominal pain/tenderness. " The initial radiographic and laboratory data are lipase 97, WBC 12.1, hemoglobin 15.5, platelet 422, creatinine 1.22, potassium 3.3.   Synetta Fail MD [facility]  How to contact the [facility] Attending or Consulting provider 7A - 7P or covering provider during after hours 7P -7A, for this patient?   1. Check the care team in [facility] and look for a) attending/consulting TR[facility] provider listed and b) the [facility] team listed 2. Log into [email or web address] and use [place]'s universal password to access. If you do not have the password, please contact the hospital operator. 3. Locate the [facility] provider you are looking for under [facility] and page to a number that you can be directly reached. 4. If you still have difficulty reaching the provider, please page the [facility] (Director on Call) for the Hospitalists listed on amion for assistance.  04/13/2020, 9:52 PM

## 2020-04-13 NOTE — ED Provider Notes (Signed)
[facility] Emergency Department Provider Note  ____________________________________________   Event Date/Time   First MD Initiated Contact with Patient 04/13/20 1802     (approximate)  I have reviewed the triage vital signs and the nursing notes.   HISTORY  Chief Complaint Abdominal pain   HPI Tamara Higgins is a 45 y.o. female with COPD, diabetes who comes in with abdominal pain.  Patient reports having abdominal pain this been going on for 10 days now.  The pain is throughout her abdomen, constant, slight improvement with Tylenol and oxycodone.  Worse with trying to eat.  Denies any nausea or vomiting but states that she just cannot eat secondary to pain.  She states that the oxycodone brings her pain down from a 10 to a 3 but only lasts for about an hour and then her symptoms come back.  Because of how little it last she is only taken 3 of her 10 pills in the past 5 days.  She states that she is needs admission previously and feels that at this point she needs to be admitted for her pancreatitis.  She also reports having a cough but denies any shortness of breath.   To note on review of records patient was seen on 2/26 at [facility].  At that time patient was diagnosed with pancreatitis.  It appears that patient was prescribed 10 pills of oxycodone 5 mg.  It was written for 5 days and today is now day 5 of illness.  Patient also had a CT scan done at this time that showed minimal stranding surrounding the pancreas with few prominent adjacent lymph nodes but thought to be associated with acute pancreatitis.  Patient had an ultrasound that showed normal gallbladder.  Also review of records patient was last admitted on 07/27/2018 for pancreatitis          Past Medical History:  Diagnosis Date  . Arthritis   . COPD (chronic obstructive pulmonary disease) (HCC)   . Diabetes mellitus without complication (HCC)   . Hypertension   . Morbid obesity [facility])     Patient  Active Problem List   Diagnosis Date Noted  . Pancreatitis 07/28/2018    Past Surgical History:  Procedure Laterality Date  . CESAREAN SECTION      Prior to Admission medications   Medication Sig Start Date End Date Taking? Authorizing Provider  acetaminophen (TYLENOL) 650 MG CR tablet Take 650 mg by mouth every 8 (eight) hours as needed for pain.    [provider]  amLODipine (NORVASC) 10 MG tablet Take 1 tablet (10 mg total) by mouth daily. 07/31/18   Gouru, Deanna Artis, MD  glipiZIDE (GLUCOTROL) 5 MG tablet Take 1 tablet (5 mg total) by mouth 2 (two) times daily before a meal. 07/30/18   Gouru, Deanna Artis, MD  metoprolol tartrate (LOPRESSOR) 100 MG tablet Take 1 tablet (100 mg total) by mouth 2 (two) times daily. 07/30/18   Gouru, Deanna Artis, MD  nicotine (NICODERM CQ - DOSED IN MG/24 HOURS) 21 mg/24hr patch Place 1 patch (21 mg total) onto the skin daily. 07/31/18   Ramonita Lab, MD  Simethicone 125 MG CAPS Take 125 mg by mouth every 6 (six) hours as needed.    [provider]    Allergies Ciprofloxacin  Family History  Problem Relation Age of Onset  . Diabetes Mellitus II Mother   . Hypertension Mother   . Hypertension Father     Social History Social History   Tobacco Use  .  Smoking status: Current Every Day Smoker  . Smokeless tobacco: Never Used  Substance Use Topics  . Alcohol use: No      Review of Systems Constitutional: No fever/chills, fatigue Eyes: No visual changes. ENT: No sore throat. Cardiovascular: Denies chest pain. Respiratory: Denies shortness of breath. Gastrointestinal: Abdominal pain, no vomiting but decreased p.o. Genitourinary: Negative for dysuria. Musculoskeletal: Negative for back pain. Skin: Negative for rash. Neurological: Negative for headaches, focal weakness or numbness. All other ROS negative ____________________________________________   PHYSICAL EXAM:  VITAL SIGNS: ED Triage Vitals  Enc Vitals Group     BP 04/13/20  1510 136/79     Pulse Rate 04/13/20 1510 71     Resp 04/13/20 1510 18     Temp 04/13/20 1510 98.5 F (36.9 C)     Temp Source 04/13/20 1510 Oral     SpO2 04/13/20 1510 95 %     Weight --      Height --      Head Circumference --      Peak Flow --      Pain Score 04/13/20 1508 7     Pain Loc --      Pain Edu? --      Excl. in GC? --     Constitutional: Alert and oriented. Well appearing and in no acute distress. Eyes: Conjunctivae are normal. EOMI. Head: Atraumatic. Nose: No congestion/rhinnorhea. Mouth/Throat: Mucous membranes are moist.   Neck: No stridor. Trachea Midline. FROM Cardiovascular: Normal rate, regular rhythm. Grossly normal heart sounds.  Good peripheral circulation. Respiratory: Normal respiratory effort.  No retractions. Lungs CTAB. Gastrointestinal: Tenderness throughout her abdomen.  No rebound or guarding no distention. No abdominal bruits.  Musculoskeletal: No lower extremity tenderness nor edema.  No joint effusions. Neurologic:  Normal speech and language. No gross focal neurologic deficits are appreciated.  Skin:  Skin is warm, dry and intact. No rash noted. Psychiatric: Mood and affect are normal. Speech and behavior are normal. GU: Deferred   ____________________________________________   LABS (all labs ordered are listed, but only abnormal results are displayed)  Labs Reviewed  LIPASE, BLOOD - Abnormal; Notable for the following components:      Result Value   Lipase 97 (*)    All other components within normal limits  COMPREHENSIVE METABOLIC PANEL - Abnormal; Notable for the following components:   Sodium 131 (*)    Potassium 3.3 (*)    Chloride 97 (*)    Glucose, Bld 191 (*)    Creatinine, Ser 1.22 (*)    GFR, Estimated 55 (*)    All other components within normal limits  CBC - Abnormal; Notable for the following components:   WBC 12.1 (*)    RBC 5.37 (*)    Hemoglobin 15.5 (*)    HCT 46.1 (*)    Platelets 422 (*)    All other  components within normal limits  URINALYSIS, COMPLETE (UACMP) WITH MICROSCOPIC - Abnormal; Notable for the following components:   Color, Urine YELLOW (*)    APPearance CLOUDY (*)    Hgb urine dipstick LARGE (*)    Protein, ur >=300 (*)    Bacteria, UA RARE (*)    All other components within normal limits  POC URINE PREG, ED   ____________________________________________    RADIOLOGY   Official radiology report(s): US ABDOMEN LIMITED RUQ (LIVER/GB)  Result Date: 04/13/2020 CLINICAL DATA:  Abdominal pain for 10 days EXAM: ULTRASOUND ABDOMEN LIMITED RIGHT UPPER QUADRANT COMPARISON:  07/27/2018 FINDINGS: Gallbladder:  No gallstones or wall thickening visualized. No sonographic Murphy sign noted by sonographer. Common bile duct: Diameter: 4 mm Liver: 2.9 x 1.7 x 2.6 cm hypoechoic structure within the right lobe liver is indeterminate, likely reflecting hemangioma. The remainder of the liver parenchyma is unremarkable. No biliary duct dilation. Portal vein is patent on color Doppler imaging with normal direction of blood flow towards the liver. Other: None. IMPRESSION: 1. 2.9 cm hyperechoic mass within the right lobe liver, likely a hemangioma. Nonemergent follow-up dedicated liver MRI could be performed for confirmation. 2. Otherwise unremarkable right upper quadrant ultrasound. No evidence of cholelithiasis or cholecystitis. Electronically Signed   By: Sharlet SalinaMichael  Brown M.D.   On: 04/13/2020 19:20    ____________________________________________   PROCEDURES  Procedure(s) performed (including Critical Care):  .1-3 Lead EKG Interpretation Performed by: Concha SeFunke, Jayln Branscom E, MD Authorized by: Concha SeFunke, Raymir Frommelt E, MD     Interpretation: normal     ECG rate:  70s   ECG rate assessment: normal     Rhythm: sinus rhythm     Ectopy: none     Conduction: normal    Ultrasound ED Peripheral IV (Provider)  Date/Time: 04/13/2020 7:30 PM Performed by: Concha SeFunke, Permelia Bamba E, MD Authorized by: Concha SeFunke, Davanna He E, MD    Procedure details:    Indications: multiple failed IV attempts     Skin Prep: chlorhexidine gluconate     Location:  Left anterior forearm   Angiocath:  20 G   Bedside Ultrasound Guided: Yes     Images: not archived     Patient tolerated procedure without complications: Yes     Dressing applied: Yes       ____________________________________________   INITIAL IMPRESSION / ASSESSMENT AND PLAN / ED COURSE  Darlina Sicilianngela M Kamara was evaluated in Emergency Department on 04/13/2020 for the symptoms described in the history of present illness. She was evaluated in the context of the global COVID-19 pandemic, which necessitated consideration that the patient might be at risk for infection with the SARS-CoV-2 virus that causes COVID-19. Institutional protocols and algorithms that pertain to the evaluation of patients at risk for COVID-19 are in a state of rapid change based on information released by regulatory bodies including the CDC and federal and state organizations. These policies and algorithms were followed during the patient's care in the ED.    Patient comes in with pancreatitis and failure to control pain at home.  Will get labs to evaluate for electrolyte abnormalities, AKI, pregnancy, UTI.  At this time I do not think we need to do repeat CT imaging to evaluate her pancreas given she states the pain is very similar.  However [facility] concern for gallstones and they only did a bedside ultrasound and they said that there were no stones.  So I do think we need to get a formal ultrasound to make sure no signs of cholecystitis that could be contributing to patient's pain.  We will give a dose of IV Dilaudid, 1 L of fluid, IV Zofran  Patient's lipase is slightly increased to 97  Patient's kidney function is up to 1.22  She shows some dehydration with low sodium, chloride and potassium.  We will keep patient on cardiac monitor due to electrolyte abnormalities.  We will give  some IV repletion and check mag.  Patient feeling better after IV Dilaudid.  Discussed with patient about options of going home versus be admitted to the hospital.  Given she failed p.o. pain  medicine at home patient would like to be admitted for continued pain medication, IV fluids  ____________________________________________   FINAL CLINICAL IMPRESSION(S) / ED DIAGNOSES   Final diagnoses:  Abdominal pain  AKI (acute kidney injury) (HCC)  Acute pancreatitis, unspecified complication status, unspecified pancreatitis type  Hypokalemia      MEDICATIONS GIVEN DURING THIS VISIT:  Medications  potassium chloride 10 mEq in 100 mL IVPB (10 mEq Intravenous New Bag/Given 04/13/20 2134)  amLODipine (NORVASC) tablet 10 mg (has no administration in time range)  metoprolol tartrate (LOPRESSOR) tablet 100 mg (has no administration in time range)  enoxaparin (LOVENOX) injection 40 mg (has no administration in time range)  sodium chloride flush (NS) 0.9 % injection 3 mL (has no administration in time range)  acetaminophen (TYLENOL) tablet 650 mg (has no administration in time range)    Or  acetaminophen (TYLENOL) suppository 650 mg (has no administration in time range)  lactated ringers infusion (has no administration in time range)  HYDROmorphone (DILAUDID) injection 0.5 mg (has no administration in time range)  insulin glargine (LANTUS) injection 5 Units (has no administration in time range)  insulin aspart (novoLOG) injection 0-15 Units (has no administration in time range)  HYDROmorphone (DILAUDID) injection 0.5 mg (0.5 mg Intravenous Given 04/13/20 1934)  sodium chloride 0.9 % bolus 1,000 mL (1,000 mLs Intravenous New Bag/Given 04/13/20 1932)  ondansetron (ZOFRAN) injection 4 mg (4 mg Intravenous Given 04/13/20 1932)     ED Discharge Orders    None       Note:  This document was prepared using Dragon voice recognition software and may include unintentional dictation errors.   Concha Se, MD 04/13/20 2212

## 2020-04-13 NOTE — ED Notes (Signed)
IV placement attempted 2 times. Second nurse called in to try.

## 2020-04-13 NOTE — ED Notes (Addendum)
Pt to ED c/o abdominal pain that started 04/04/20, got worse today, "I couldn't take it anymore" and "pills aren't working". Pt was diagnosed with pancreatitis 3d ago at [facility] ER. Pt has had pancreatitis 5 times since 2021. Denies N/V. States cannot keep food down, only water. Denies urinary symptoms.

## 2020-04-13 NOTE — ED Triage Notes (Addendum)
Pt comes via EMS with c/o abdominal pain. Pt states she was dx with pancreatitis.  Pt states she was given pain medication. Pt states no relief with medication and needs that to be reevaluated.  EMS reports VSS

## 2020-04-13 NOTE — ED Notes (Signed)
Second nurse attempted IV placement. Third nurse will come attempt ultrasound IV placement.

## 2020-04-14 ENCOUNTER — Encounter: Payer: Self-pay | Admitting: Internal Medicine

## 2020-04-14 ENCOUNTER — Inpatient Hospital Stay: Payer: BC Managed Care – PPO

## 2020-04-14 DIAGNOSIS — I10 Essential (primary) hypertension: Secondary | ICD-10-CM | POA: Diagnosis present

## 2020-04-14 DIAGNOSIS — N179 Acute kidney failure, unspecified: Secondary | ICD-10-CM | POA: Diagnosis present

## 2020-04-14 DIAGNOSIS — E119 Type 2 diabetes mellitus without complications: Secondary | ICD-10-CM | POA: Diagnosis present

## 2020-04-14 DIAGNOSIS — K859 Acute pancreatitis without necrosis or infection, unspecified: Principal | ICD-10-CM

## 2020-04-14 DIAGNOSIS — K861 Other chronic pancreatitis: Secondary | ICD-10-CM | POA: Diagnosis present

## 2020-04-14 DIAGNOSIS — J449 Chronic obstructive pulmonary disease, unspecified: Secondary | ICD-10-CM | POA: Diagnosis present

## 2020-04-14 DIAGNOSIS — Z6841 Body Mass Index (BMI) 40.0 and over, adult: Secondary | ICD-10-CM | POA: Diagnosis not present

## 2020-04-14 DIAGNOSIS — K5900 Constipation, unspecified: Secondary | ICD-10-CM | POA: Diagnosis present

## 2020-04-14 DIAGNOSIS — Z79899 Other long term (current) drug therapy: Secondary | ICD-10-CM | POA: Diagnosis not present

## 2020-04-14 DIAGNOSIS — E860 Dehydration: Secondary | ICD-10-CM | POA: Diagnosis not present

## 2020-04-14 DIAGNOSIS — F5089 Other specified eating disorder: Secondary | ICD-10-CM | POA: Diagnosis present

## 2020-04-14 DIAGNOSIS — Z833 Family history of diabetes mellitus: Secondary | ICD-10-CM | POA: Diagnosis not present

## 2020-04-14 DIAGNOSIS — Z7984 Long term (current) use of oral hypoglycemic drugs: Secondary | ICD-10-CM | POA: Diagnosis not present

## 2020-04-14 DIAGNOSIS — Z20822 Contact with and (suspected) exposure to covid-19: Secondary | ICD-10-CM | POA: Diagnosis present

## 2020-04-14 DIAGNOSIS — F17200 Nicotine dependence, unspecified, uncomplicated: Secondary | ICD-10-CM | POA: Diagnosis present

## 2020-04-14 DIAGNOSIS — R768 Other specified abnormal immunological findings in serum: Secondary | ICD-10-CM | POA: Diagnosis not present

## 2020-04-14 DIAGNOSIS — E876 Hypokalemia: Secondary | ICD-10-CM | POA: Diagnosis present

## 2020-04-14 LAB — CBC
HCT: 44.1 % (ref 36.0–46.0)
Hemoglobin: 14.5 g/dL (ref 12.0–15.0)
MCH: 28.3 pg (ref 26.0–34.0)
MCHC: 32.9 g/dL (ref 30.0–36.0)
MCV: 86.1 fL (ref 80.0–100.0)
Platelets: 368 10*3/uL (ref 150–400)
RBC: 5.12 MIL/uL — ABNORMAL HIGH (ref 3.87–5.11)
RDW: 15 % (ref 11.5–15.5)
WBC: 12.7 10*3/uL — ABNORMAL HIGH (ref 4.0–10.5)
nRBC: 0 % (ref 0.0–0.2)

## 2020-04-14 LAB — LIPID PANEL
Cholesterol: 213 mg/dL — ABNORMAL HIGH (ref 0–200)
HDL: 22 mg/dL — ABNORMAL LOW (ref >40)
LDL Cholesterol: 142 mg/dL — ABNORMAL HIGH (ref 0–99)
Total CHOL/HDL Ratio: 9.7 RATIO
Triglycerides: 244 mg/dL — ABNORMAL HIGH (ref <150)
VLDL: 49 mg/dL — ABNORMAL HIGH (ref 0–40)

## 2020-04-14 LAB — BASIC METABOLIC PANEL
Anion gap: 12 (ref 5–15)
BUN: 11 mg/dL (ref 6–20)
CO2: 21 mmol/L — ABNORMAL LOW (ref 22–32)
Calcium: 8.7 mg/dL — ABNORMAL LOW (ref 8.9–10.3)
Chloride: 99 mmol/L (ref 98–111)
Creatinine, Ser: 1.17 mg/dL — ABNORMAL HIGH (ref 0.44–1.00)
GFR, Estimated: 58 mL/min — ABNORMAL LOW (ref >60)
Glucose, Bld: 176 mg/dL — ABNORMAL HIGH (ref 70–99)
Potassium: 3.4 mmol/L — ABNORMAL LOW (ref 3.5–5.1)
Sodium: 132 mmol/L — ABNORMAL LOW (ref 135–145)

## 2020-04-14 LAB — GLUCOSE, CAPILLARY
Glucose-Capillary: 213 mg/dL — ABNORMAL HIGH (ref 70–99)
Glucose-Capillary: 177 mg/dL — ABNORMAL HIGH (ref 70–99)

## 2020-04-14 LAB — CBG MONITORING, ED
Glucose-Capillary: 193 mg/dL — ABNORMAL HIGH (ref 70–99)
Glucose-Capillary: 173 mg/dL — ABNORMAL HIGH (ref 70–99)
Glucose-Capillary: 168 mg/dL — ABNORMAL HIGH (ref 70–99)
Glucose-Capillary: 171 mg/dL — ABNORMAL HIGH (ref 70–99)

## 2020-04-14 LAB — LIPASE, BLOOD: Lipase: 80 U/L — ABNORMAL HIGH (ref 11–51)

## 2020-04-14 LAB — HIV ANTIBODY (ROUTINE TESTING W REFLEX): HIV Screen 4th Generation wRfx: NONREACTIVE

## 2020-04-14 MED ORDER — GADOBUTROL 1 MMOL/ML IV SOLN
10.0000 mL | Freq: Once | INTRAVENOUS | Status: AC | PRN
  Administered 2020-04-14: 10 mL via INTRAVENOUS

## 2020-04-14 MED ORDER — POTASSIUM CHLORIDE CRYS ER 20 MEQ PO TBCR
40.0000 meq | EXTENDED_RELEASE_TABLET | Freq: Once | ORAL | Status: AC
  Administered 2020-04-14: 40 meq via ORAL
  Filled 2020-04-14 (×2): qty 2

## 2020-04-14 MED ORDER — ENOXAPARIN SODIUM 80 MG/0.8ML ~~LOC~~ SOLN
80.0000 mg | SUBCUTANEOUS | Status: DC
  Administered 2020-04-14 – 2020-04-18 (×5): 80 mg via SUBCUTANEOUS
  Filled 2020-04-14 (×5): qty 0.8

## 2020-04-14 MED ORDER — ENOXAPARIN SODIUM 80 MG/0.8ML ~~LOC~~ SOLN
0.5000 mg/kg | SUBCUTANEOUS | Status: DC
  Filled 2020-04-14: qty 1.6

## 2020-04-14 NOTE — ED Notes (Signed)
Report received from Biagio Borg. RN. Patient transferred to room 35 at this time and changed into appropriate gown. Patient placed on continuous pulse ox and cardiac monitor. Belongings at bedside. Requesting hospital bed. This RN spoke with unit secretary who reports there are no available beds at this time. Patient aware she will be provided with more comfortable bed if becomes available. Denies other needs at this time. Call bell within reach and able to use.

## 2020-04-14 NOTE — Progress Notes (Signed)
PHARMACIST - PHYSICIAN COMMUNICATION  CONCERNING:  Enoxaparin (Lovenox) for DVT Prophylaxis    RECOMMENDATION: Patient was prescribed enoxaprin 40mg  q24 hours for VTE prophylaxis.   Filed Weights   04/14/20 0600  Weight: (!) 163.5 kg (360 lb 8 oz)    Body mass index is 65.94 kg/m.  Estimated Creatinine Clearance: 90.6 mL/min (A) (by C-G formula based on SCr of 1.17 mg/dL (H)).   [facility] patient is candidate for enoxaparin 0.5mg /kg TBW SQ every 24 hours based on BMI being >30.  DESCRIPTION: Pharmacy has adjusted enoxaparin dose per [facility] policy.  Patient is now receiving enoxaparin 0.5 mg/kg every 24 hours    [facility], PharmD Clinical Pharmacist  04/14/2020 7:04 AM

## 2020-04-14 NOTE — ED Notes (Signed)
Pt ambulatory to bathroom

## 2020-04-14 NOTE — Progress Notes (Signed)
Patient ID: Tamara Higgins, female   DOB: [birth year], 45 y.o.   MRN: [id number] [facility] PROGRESS NOTE  Tamara Higgins PNT:[id number] DOB: 28-Sep-1975 DOA: 04/13/2020 PCP: Patient, No Pcp Per  HPI/Subjective: Patient still having abdominal pain.  She states has been going on since 20 February.  She was over at [facility] on the 26th and sent home.  On 04/10/2020, she had a CAT scan consistent with pancreatitis.  In the ER here, she had an ultrasound that did not show any signs of gallbladder issues but did have a 2.9 cm mass possibly hemangioma.  She was still having quite a bit of pain 9 out of 10 in intensity.  No nausea or vomiting.  Was able to tolerate some liquids today.  Objective: Vitals:   04/14/20 1208 04/14/20 1431  BP: 139/69 123/69  Pulse: 67 (!) 55  Resp: 16 16  Temp:  98.4 F (36.9 C)  SpO2: 96% 95%    Intake/Output Summary (Last 24 hours) at 04/14/2020 1622 Last data filed at 04/13/2020 2239 Gross per 24 hour  Intake 2000 ml  Output --  Net 2000 ml   Filed Weights   04/14/20 0600  Weight: (!) 163.5 kg    ROS: Review of Systems  Respiratory: Negative for cough and shortness of breath.   Cardiovascular: Negative for chest pain.  Gastrointestinal: Positive for abdominal pain. Negative for diarrhea, nausea and vomiting.   Exam: Physical Exam HENT:     Head: Normocephalic.     Mouth/Throat:     Pharynx: No oropharyngeal exudate.  Eyes:     General: Lids are normal.     Conjunctiva/sclera: Conjunctivae normal.     Pupils: Pupils are equal, round, and reactive to light.  Cardiovascular:     Rate and Rhythm: Normal rate and regular rhythm.     Heart sounds: Normal heart sounds, S1 normal and S2 normal.  Pulmonary:     Breath sounds: Examination of the right-lower field reveals decreased breath sounds. Examination of the left-lower field reveals decreased breath sounds. Decreased breath sounds present. No wheezing or rhonchi.  Abdominal:     Palpations: Abdomen  is soft.     Tenderness: There is abdominal tenderness in the right upper quadrant and epigastric area.  Musculoskeletal:     Right lower leg: No swelling.     Left lower leg: No swelling.  Skin:    General: Skin is warm.     Findings: No rash.  Neurological:     Mental Status: She is alert and oriented to person, place, and time.       Data Reviewed: Basic Metabolic Panel: Recent Labs  Lab 04/13/20 1512 04/14/20 0457  NA 131* 132*  K 3.3* 3.4*  CL 97* 99  CO2 25 21*  GLUCOSE 191* 176*  BUN 11 11  CREATININE 1.22* 1.17*  CALCIUM 8.9 8.7*  MG 1.9  --    Liver Function Tests: Recent Labs  Lab 04/13/20 1512  AST 29  ALT 21  ALKPHOS 58  BILITOT 0.7  PROT 7.6  ALBUMIN 3.5   Recent Labs  Lab 04/13/20 1512 04/14/20 0457  LIPASE 97* 80*   CBC: Recent Labs  Lab 04/13/20 1512 04/14/20 0457  WBC 12.1* 12.7*  HGB 15.5* 14.5  HCT 46.1* 44.1  MCV 85.8 86.1  PLT 422* 368    CBG: Recent Labs  Lab 04/14/20 0119 04/14/20 0501 04/14/20 0806 04/14/20 1207  GLUCAP 193* 173* 168* 171*    Recent Results (  from the past 240 hour(s))  Resp Panel by RT-PCR (Flu A&B, Covid) Nasopharyngeal Swab     Status: None   Collection Time: 04/13/20  6:40 PM   Specimen: Nasopharyngeal Swab; Nasopharyngeal(NP) swabs in vial transport medium  Result Value Ref Range Status   SARS Coronavirus 2 by RT PCR NEGATIVE NEGATIVE Final    Comment: (NOTE) SARS-CoV-2 target nucleic acids are NOT DETECTED.  The SARS-CoV-2 RNA is generally detectable in upper respiratory specimens during the acute phase of infection. The lowest concentration of SARS-CoV-2 viral copies this assay can detect is 138 copies/mL. A negative result does not preclude SARS-Cov-2 infection and should not be used as the sole basis for treatment or other patient management decisions. A negative result may occur with  improper specimen collection/handling, submission of specimen other than nasopharyngeal swab,  presence of viral mutation(s) within the areas targeted by this assay, and inadequate number of viral copies(<138 copies/mL). A negative result must be combined with clinical observations, patient history, and epidemiological information. The expected result is Negative.  Fact Sheet for Patients:  BloggerCourse.com  Fact Sheet for Healthcare Providers:  SeriousBroker.it  This test is no t yet approved or cleared by the [place] FDA and  has been authorized for detection and/or diagnosis of SARS-CoV-2 by FDA under an Emergency Use Authorization (EUA). This EUA will remain  in effect (meaning this test can be used) for the duration of the COVID-19 declaration under Section 564(b)(1) of the Act, 21 U.S.C.section 360bbb-3(b)(1), unless the authorization is terminated  or revoked sooner.       Influenza A by PCR NEGATIVE NEGATIVE Final   Influenza B by PCR NEGATIVE NEGATIVE Final    Comment: (NOTE) The Xpert Xpress SARS-CoV-2/FLU/RSV plus assay is intended as an aid in the diagnosis of influenza from Nasopharyngeal swab specimens and should not be used as a sole basis for treatment. Nasal washings and aspirates are unacceptable for Xpert Xpress SARS-CoV-2/FLU/RSV testing.  Fact Sheet for Patients: BloggerCourse.com  Fact Sheet for Healthcare Providers: SeriousBroker.it  This test is not yet approved or cleared by the [place] FDA and has been authorized for detection and/or diagnosis of SARS-CoV-2 by FDA under an Emergency Use Authorization (EUA). This EUA will remain in effect (meaning this test can be used) for the duration of the COVID-19 declaration under Section 564(b)(1) of the Act, 21 U.S.C. section 360bbb-3(b)(1), unless the authorization is terminated or revoked.  Performed at [facility], [address] [place], [place] [address]       Studies: US ABDOMEN LIMITED RUQ (LIVER/GB)  Result Date: 04/13/2020 CLINICAL DATA:  Abdominal pain for 10 days EXAM: ULTRASOUND ABDOMEN LIMITED RIGHT UPPER QUADRANT COMPARISON:  07/27/2018 FINDINGS: Gallbladder: No gallstones or wall thickening visualized. No sonographic Murphy sign noted by sonographer. Common bile duct: Diameter: 4 mm Liver: 2.9 x 1.7 x 2.6 cm hypoechoic structure within the right lobe liver is indeterminate, likely reflecting hemangioma. The remainder of the liver parenchyma is unremarkable. No biliary duct dilation. Portal vein is patent on color Doppler imaging with normal direction of blood flow towards the liver. Other: None. IMPRESSION: 1. 2.9 cm hyperechoic mass within the right lobe liver, likely a hemangioma. Nonemergent follow-up dedicated liver MRI could be performed for confirmation. 2. Otherwise unremarkable right upper quadrant ultrasound. No evidence of cholelithiasis or cholecystitis. Electronically Signed   By: Sharlet Salina M.D.   On: 04/13/2020 19:20    Scheduled Meds: . amLODipine  10 mg Oral Daily  . enoxaparin (  LOVENOX) injection  80 mg Subcutaneous Q24H  . insulin aspart  0-15 Units Subcutaneous TID WC  . insulin glargine  5 Units Subcutaneous QHS  . metoprolol tartrate  100 mg Oral BID  . sodium chloride flush  3 mL Intravenous Q12H   Continuous Infusions: . lactated ringers 125 mL/hr at 04/14/20 0813    Assessment/Plan:  1. Acute pancreatitis.  Unclear etiology.  Patient does not drink alcohol, triglycerides normal range and gallbladder imaging unremarkable.  Water pills are rare causes of that and they are held at this point.  We will get an MRCP of the abdomen. 2. Essential hypertension on Norvasc and metoprolol. 3. Type 2 diabetes mellitus.  Holding glipizide and on low-dose glargine insulin 4. Dehydration.  IV fluids to help out with creatinine. 5. Likely hemangioma seen on ultrasound.  MRCP for further evaluation. 6. Morbid obesity with a  BMI of 65.94. 7. Hypokalemia.  Replace potassium.        Code Status:     Code Status Orders  (From admission, onward)         Start     Ordered   04/13/20 2133  Full code  Continuous        04/13/20 2137        Code Status History    Date Active Date Inactive Code Status Order ID Comments User Context   07/28/2018 0902 07/30/2018 2012 Full Code [id number]  Arnaldo Natal, MD Inpatient   Advance Care Planning Activity     Family Communication: Deferred Disposition Plan: Status is: Inpatient  Dispo: The patient is from: Home              Anticipated d/c is to: Home              Patient currently requiring too much pain medications to make a disposition at this point   Difficult to place patient.  No.  Time spent: 28 minutes  Annalyn Blecher [employer]

## 2020-04-14 NOTE — ED Notes (Signed)
Patient ambulatory to the restroom.

## 2020-04-15 DIAGNOSIS — E119 Type 2 diabetes mellitus without complications: Secondary | ICD-10-CM | POA: Diagnosis not present

## 2020-04-15 DIAGNOSIS — K859 Acute pancreatitis without necrosis or infection, unspecified: Secondary | ICD-10-CM | POA: Diagnosis not present

## 2020-04-15 DIAGNOSIS — I10 Essential (primary) hypertension: Secondary | ICD-10-CM | POA: Diagnosis not present

## 2020-04-15 LAB — GLUCOSE, CAPILLARY
Glucose-Capillary: 166 mg/dL — ABNORMAL HIGH (ref 70–99)
Glucose-Capillary: 189 mg/dL — ABNORMAL HIGH (ref 70–99)
Glucose-Capillary: 203 mg/dL — ABNORMAL HIGH (ref 70–99)
Glucose-Capillary: 194 mg/dL — ABNORMAL HIGH (ref 70–99)

## 2020-04-15 LAB — COMPREHENSIVE METABOLIC PANEL
ALT: 24 U/L (ref 0–44)
AST: 25 U/L (ref 15–41)
Albumin: 3.2 g/dL — ABNORMAL LOW (ref 3.5–5.0)
Alkaline Phosphatase: 56 U/L (ref 38–126)
Anion gap: 8 (ref 5–15)
BUN: 8 mg/dL (ref 6–20)
CO2: 25 mmol/L (ref 22–32)
Calcium: 8.9 mg/dL (ref 8.9–10.3)
Chloride: 101 mmol/L (ref 98–111)
Creatinine, Ser: 1 mg/dL (ref 0.44–1.00)
GFR, Estimated: >60 mL/min (ref >60)
Glucose, Bld: 168 mg/dL — ABNORMAL HIGH (ref 70–99)
Potassium: 3.7 mmol/L (ref 3.5–5.1)
Sodium: 134 mmol/L — ABNORMAL LOW (ref 135–145)
Total Bilirubin: 0.7 mg/dL (ref 0.3–1.2)
Total Protein: 7 g/dL (ref 6.5–8.1)

## 2020-04-15 LAB — LIPASE, BLOOD: Lipase: 104 U/L — ABNORMAL HIGH (ref 11–51)

## 2020-04-15 LAB — CBC
HCT: 41.4 % (ref 36.0–46.0)
Hemoglobin: 14.1 g/dL (ref 12.0–15.0)
MCH: 29.3 pg (ref 26.0–34.0)
MCHC: 34.1 g/dL (ref 30.0–36.0)
MCV: 85.9 fL (ref 80.0–100.0)
Platelets: 393 10*3/uL (ref 150–400)
RBC: 4.82 MIL/uL (ref 3.87–5.11)
RDW: 15 % (ref 11.5–15.5)
WBC: 9 10*3/uL (ref 4.0–10.5)
nRBC: 0 % (ref 0.0–0.2)

## 2020-04-15 LAB — MAGNESIUM: Magnesium: 1.5 mg/dL — ABNORMAL LOW (ref 1.7–2.4)

## 2020-04-15 MED ORDER — POLYETHYLENE GLYCOL 3350 17 G PO PACK
17.0000 g | PACK | Freq: Every day | ORAL | Status: DC
  Administered 2020-04-15 – 2020-04-16 (×2): 17 g via ORAL
  Filled 2020-04-15 (×2): qty 1

## 2020-04-15 MED ORDER — MAGNESIUM SULFATE 2 GM/50ML IV SOLN
2.0000 g | Freq: Once | INTRAVENOUS | Status: AC
  Administered 2020-04-15: 2 g via INTRAVENOUS
  Filled 2020-04-15: qty 50

## 2020-04-15 MED ORDER — OXYCODONE HCL 5 MG PO TABS: 5.0000 mg | ORAL_TABLET | Freq: Four times a day (QID) | ORAL | Status: DC | PRN

## 2020-04-15 MED ORDER — OXYCODONE HCL 5 MG PO TABS
5.0000 mg | ORAL_TABLET | Freq: Four times a day (QID) | ORAL | Status: DC | PRN
  Administered 2020-04-15 – 2020-04-17 (×8): 5 mg via ORAL
  Filled 2020-04-15 (×10): qty 1

## 2020-04-15 MED ORDER — ACETAMINOPHEN 650 MG RE SUPP: 650.0000 mg | Freq: Four times a day (QID) | RECTAL | Status: DC | PRN

## 2020-04-15 MED ORDER — ACETAMINOPHEN 325 MG PO TABS: 650.0000 mg | ORAL_TABLET | Freq: Four times a day (QID) | ORAL | Status: DC | PRN

## 2020-04-15 NOTE — Progress Notes (Signed)
SURGICAL CONSULTATION NOTE   HISTORY OF PRESENT ILLNESS (HPI):  45 y.o. female presented to [facility] ED for evaluation of abdominal pain. Patient reports she has been having abdominal pain since few weeks ago.  She reported that she was seen at [facility] due to pancreatitis.  She was treated with IV fluids and pain medications.  To report that she has never been pain-free.  She continued having epigastric pain.  The pain radiates to her back.  There has been no alleviating or aggravating factors.  She report minimal improvement with pain medications.  Ultrasound at [facility] and at [facility] show normal gallbladder without gallstones.  MRCP also negative for any gallbladder pathology or any stones identified.  I personally evaluated the images of the abdominal ultrasound and MRCP.  She was found with lipase of 97 2 days ago and today the lipase is 104.  Initially patient admitted with leukocytosis of 12,000 but now normalized.  Surgery is consulted by Dr. Renae Gloss in this context for evaluation and management of pancreatitis.  PAST MEDICAL HISTORY (PMH):  Past Medical History:  Diagnosis Date  . Arthritis   . COPD (chronic obstructive pulmonary disease) (HCC)   . Diabetes mellitus without complication (HCC)   . Hypertension   . Morbid obesity (HCC)      PAST SURGICAL HISTORY (PSH):  Past Surgical History:  Procedure Laterality Date  . CESAREAN SECTION       MEDICATIONS:  Prior to Admission medications   Medication Sig Start Date End Date Taking? Authorizing Provider  acetaminophen (TYLENOL) 650 MG CR tablet Take 650 mg by mouth every 8 (eight) hours as needed for pain.   Yes [provider]  amLODipine (NORVASC) 10 MG tablet Take 1 tablet (10 mg total) by mouth daily. 07/31/18  Yes Gouru, Deanna Artis, MD  furosemide (LASIX) 20 MG tablet Take 20 mg by mouth daily as needed. 03/29/20  Yes [provider]  glipiZIDE (GLUCOTROL) 5 MG tablet Take 1 tablet (5 mg total) by mouth 2 (two) times daily  before a meal. 07/30/18  Yes Gouru, Aruna, MD  hydrochlorothiazide (HYDRODIURIL) 25 MG tablet Take 37.5 mg by mouth daily. 03/29/20  Yes [provider]  lactulose (CHRONULAC) 10 GM/15ML solution Take 10 g by mouth 2 (two) times daily. 04/06/20  Yes [provider]  medroxyPROGESTERone (PROVERA) 10 MG tablet Take 10 mg by mouth daily. 03/29/20  Yes [provider]  metoprolol tartrate (LOPRESSOR) 100 MG tablet Take 1 tablet (100 mg total) by mouth 2 (two) times daily. Patient taking differently: Take 150 mg by mouth 2 (two) times daily. 07/30/18  Yes Gouru, Deanna Artis, MD  norethindrone (AYGESTIN) 5 MG tablet Take 10 mg by mouth daily. 03/29/20  Yes [provider]  oxyCODONE (OXY IR/ROXICODONE) 5 MG immediate release tablet Take 5 mg by mouth every 6 (six) hours as needed. 04/12/20  Yes [provider]  Simethicone 125 MG CAPS Take 125 mg by mouth every 6 (six) hours as needed.   Yes [provider]  nicotine (NICODERM CQ - DOSED IN MG/24 HOURS) 21 mg/24hr patch Place 1 patch (21 mg total) onto the skin daily. Patient not taking: No sig reported 07/31/18   Ramonita Lab, MD     ALLERGIES:  Allergies  Allergen Reactions  . Ciprofloxacin Other (See Comments)    Body stiffens/ tightens      SOCIAL HISTORY:  Social History   Socioeconomic History  . Marital status: Single    Spouse name: Not on file  .  Number of children: Not on file  . Years of education: Not on file  . Highest education level: Not on file  Occupational History  . Not on file  Tobacco Use  . Smoking status: Current Every Day Smoker  . Smokeless tobacco: Never Used  Substance and Sexual Activity  . Alcohol use: No  . Drug use: Not on file  . Sexual activity: Not on file  Other Topics Concern  . Not on file  Social History Narrative  . Not on file   Social Determinants of Health   Financial Resource Strain: Not on file  Food Insecurity: Not on file  Transportation  Needs: Not on file  Physical Activity: Not on file  Stress: Not on file  Social Connections: Not on file  Intimate Partner Violence: Not on file      FAMILY HISTORY:  Family History  Problem Relation Age of Onset  . Diabetes Mellitus II Mother   . Hypertension Mother   . Hypertension Father      REVIEW OF SYSTEMS:  Constitutional: denies weight loss, fever, chills, or sweats  Eyes: denies any other vision changes, history of eye injury  ENT: denies sore throat, hearing problems  Respiratory: denies shortness of breath, wheezing  Cardiovascular: denies chest pain, palpitations  Gastrointestinal: Positive abdominal pain, nausea and vomiting Genitourinary: denies burning with urination or urinary frequency Musculoskeletal: denies any other joint pains or cramps  Skin: denies any other rashes or skin discolorations  Neurological: denies any other headache, dizziness, weakness  Psychiatric: denies any other depression, anxiety   All other review of systems were negative   VITAL SIGNS:  Temp:  [98.2 F (36.8 C)-98.8 F (37.1 C)] 98.8 F (37.1 C) (03/03 1152) Pulse Rate:  [59-75] 68 (03/03 1152) Resp:  [18-20] 20 (03/03 1152) BP: (133-164)/(56-76) 135/56 (03/03 1152) SpO2:  [93 %-99 %] 93 % (03/03 1152)     Height: 5\' 2"  (157.5 cm) Weight: (!) 163.5 kg BMI (Calculated): 65.92   INTAKE/OUTPUT:  This shift: Total I/O In: 120 [P.O.:120] Out: -   Last 2 shifts: @IOLAST2SHIFTS @   PHYSICAL EXAM:  Constitutional:  -- Normal body habitus  -- Awake, alert, and oriented x3  Eyes:  -- Pupils equally round and reactive to light  -- No scleral icterus  Ear, nose, and throat:  -- No jugular venous distension  Pulmonary:  -- No crackles  -- Equal breath sounds bilaterally -- Breathing non-labored at rest Cardiovascular:  -- S1, S2 present  -- No pericardial rubs Gastrointestinal:  -- Abdomen soft, nontender, non-distended, no guarding or rebound tenderness -- No abdominal  masses appreciated, pulsatile or otherwise  Musculoskeletal and Integumentary:  -- Wounds: None appreciated -- Extremities: B/L UE and LE FROM, hands and feet warm, no edema  Neurologic:  -- Motor function: intact and symmetric -- Sensation: intact and symmetric   Labs:  CBC Latest Ref Rng & Units 04/15/2020 04/14/2020 04/13/2020  WBC 4.0 - 10.5 K/uL 9.0 12.7(H) 12.1(H)  Hemoglobin 12.0 - 15.0 g/dL 06/14/2020 06/13/2020 15.5(H)  Hematocrit 36.0 - 46.0 % 41.4 44.1 46.1(H)  Platelets 150 - 400 K/uL 393 368 422(H)   CMP Latest Ref Rng & Units 04/15/2020 04/14/2020 04/13/2020  Glucose 70 - 99 mg/dL 06/14/2020) 06/13/2020) 638(V)  BUN 6 - 20 mg/dL 8 11 11   Creatinine 0.44 - 1.00 mg/dL 564(P 329(J) )  Sodium 135 - 145 mmol/L 134(L) 132(L) 131(L)  Potassium 3.5 - 5.1 mmol/L 3.7 3.4(L) 3.3(L)  Chloride 98 - 111 mmol/L  101 99 97(L)  CO2 22 - 32 mmol/L 25 21(L) 25  Calcium 8.9 - 10.3 mg/dL 8.9 5.4(O) 8.9  Total Protein 6.5 - 8.1 g/dL 7.0 - 7.6  Total Bilirubin 0.3 - 1.2 mg/dL 0.7 - 0.7  Alkaline Phos 38 - 126 U/L 56 - 58  AST 15 - 41 U/L 25 - 29  ALT 0 - 44 U/L 24 - 21    Imaging studies:  EXAM: ULTRASOUND ABDOMEN LIMITED RIGHT UPPER QUADRANT  COMPARISON:  07/27/2018  FINDINGS: Gallbladder:  No gallstones or wall thickening visualized. No sonographic Murphy sign noted by sonographer.  Common bile duct:  Diameter: 4 mm  Liver:  2.9 x 1.7 x 2.6 cm hypoechoic structure within the right lobe liver is indeterminate, likely reflecting hemangioma. The remainder of the liver parenchyma is unremarkable. No biliary duct dilation. Portal vein is patent on color Doppler imaging with normal direction of blood flow towards the liver.  Other: None.  IMPRESSION: 1. 2.9 cm hyperechoic mass within the right lobe liver, likely a hemangioma. Nonemergent follow-up dedicated liver MRI could be performed for confirmation. 2. Otherwise unremarkable right upper quadrant ultrasound. No evidence of  cholelithiasis or cholecystitis.   Electronically Signed   By: Sharlet Salina M.D.   On: 04/13/2020 19:20  Assessment/Plan:  45 y.o. female with acute pancreatitis, complicated by pertinent comorbidities including morbid obesity, COPD, recurrent pancreatitis, diabetes, hypertension.  Patient with recurrent pancreatitis.  She has had multiple studies were no gallstones or sludge or any gallbladder pathology has been identified.  Patient with persistent pain.  At this moment there is no known etiology of her recurrent pancreatitis.  I would not recommend surgical management of the gallbladder until all other causes of pancreatitis has been ruled out.  After resolution of her pancreatitis I think that this patient can be seen as outpatient by gastroenterology for complete work-up.  I discussed with the patient that I would not recommend cholecystectomy only as a last resource since there is high chance that even after cholecystectomy she continued having recurrent proctitis since we will have unknown cause.  Also at this point patient may be having chronic pancreatitis that is very difficult.  She is basically aggressive pain management.  No surgical management intervention during this admission.  Gae Gallop, MD

## 2020-04-15 NOTE — Progress Notes (Signed)
Patient ID: Tamara Higgins, female   DOB: [birth year], 45 y.o.   MRN: [id number] [facility] PROGRESS NOTE  Tamara Higgins [facility]:[id number] DOB: 03-17-1975 DOA: 04/13/2020 PCP: Patient, No Pcp Per  HPI/Subjective: Patient feels a little bit better today than yesterday. Still having pain in the abdomen sometimes as high as 8 out of 10 intensity. No nausea or vomiting. Some constipation. Admitted with pancreatitis.  Objective: Vitals:   04/15/20 0849 04/15/20 1152  BP: (!) 146/61 (!) 135/56  Pulse: 75 68  Resp: 18 20  Temp: 98.7 F (37.1 C) 98.8 F (37.1 C)  SpO2: 99% 93%    Intake/Output Summary (Last 24 hours) at 04/15/2020 1529 Last data filed at 04/15/2020 1406 Gross per 24 hour  Intake 360 ml  Output -  Net 360 ml   Filed Weights   04/14/20 0600  Weight: (!) 163.5 kg    ROS: Review of Systems  Respiratory: Positive for cough. Negative for shortness of breath.   Cardiovascular: Negative for chest pain.  Gastrointestinal: Positive for abdominal pain and constipation. Negative for nausea and vomiting.   Exam: Physical Exam HENT:     Head: Normocephalic.     Mouth/Throat:     Pharynx: No oropharyngeal exudate.  Eyes:     General: Lids are normal.     Conjunctiva/sclera: Conjunctivae normal.     Pupils: Pupils are equal, round, and reactive to light.  Cardiovascular:     Rate and Rhythm: Normal rate and regular rhythm.     Heart sounds: Normal heart sounds, S1 normal and S2 normal.  Pulmonary:     Breath sounds: No decreased breath sounds, wheezing, rhonchi or rales.  Abdominal:     Palpations: Abdomen is soft.     Tenderness: There is abdominal tenderness in the right upper quadrant, epigastric area and left upper quadrant.  Musculoskeletal:     Right lower leg: Swelling present.     Left lower leg: Swelling present.  Skin:    General: Skin is warm.     Findings: No rash.  Neurological:     Mental Status: She is alert and oriented to person, place, and time.        Data Reviewed: Basic Metabolic Panel: Recent Labs  Lab 04/13/20 1512 04/14/20 0457 04/15/20 0547  NA 131* 132* 134*  K 3.3* 3.4* 3.7  CL 97* 99 101  CO2 25 21* 25  GLUCOSE 191* 176* 168*  BUN 11 11 8   CREATININE 1.22* 1.17* 1.00  CALCIUM 8.9 8.7* 8.9  MG 1.9  --  1.5*   Liver Function Tests: Recent Labs  Lab 04/13/20 1512 04/15/20 0547  AST 29 25  ALT 21 24  ALKPHOS 58 56  BILITOT 0.7 0.7  PROT 7.6 7.0  ALBUMIN 3.5 3.2*   Recent Labs  Lab 04/13/20 1512 04/14/20 0457 04/15/20 0547  LIPASE 97* 80* 104*   CBC: Recent Labs  Lab 04/13/20 1512 04/14/20 0457 04/15/20 0722  WBC 12.1* 12.7* 9.0  HGB 15.5* 14.5 14.1  HCT 46.1* 44.1 41.4  MCV 85.8 86.1 85.9  PLT 422* 368 393    CBG: Recent Labs  Lab 04/14/20 1207 04/14/20 1748 04/14/20 2025 04/15/20 0756 04/15/20 1153  GLUCAP 171* 213* 177* 166* 189*    Recent Results (from the past 240 hour(s))  Resp Panel by RT-PCR (Flu A&B, Covid) Nasopharyngeal Swab     Status: None   Collection Time: 04/13/20  6:40 PM   Specimen: Nasopharyngeal Swab; Nasopharyngeal(NP) swabs in vial  transport medium  Result Value Ref Range Status   SARS Coronavirus 2 by RT PCR NEGATIVE NEGATIVE Final    Comment: (NOTE) SARS-CoV-2 target nucleic acids are NOT DETECTED.  The SARS-CoV-2 RNA is generally detectable in upper respiratory specimens during the acute phase of infection. The lowest concentration of SARS-CoV-2 viral copies this assay can detect is 138 copies/mL. A negative result does not preclude SARS-Cov-2 infection and should not be used as the sole basis for treatment or other patient management decisions. A negative result may occur with  improper specimen collection/handling, submission of specimen other than nasopharyngeal swab, presence of viral mutation(s) within the areas targeted by this assay, and inadequate number of viral copies(<138 copies/mL). A negative result must be combined with clinical  observations, patient history, and epidemiological information. The expected result is Negative.  Fact Sheet for Patients:  BloggerCourse.comhttps://www.fda.gov/media/152166/download  Fact Sheet for Healthcare Providers:  SeriousBroker.ithttps://www.fda.gov/media/152162/download  This test is no t yet approved or cleared by the [place] FDA and  has been authorized for detection and/or diagnosis of SARS-CoV-2 by FDA under an Emergency Use Authorization (EUA). This EUA will remain  in effect (meaning this test can be used) for the duration of the COVID-19 declaration under Section 564(b)(1) of the Act, 21 U.S.C.section 360bbb-3(b)(1), unless the authorization is terminated  or revoked sooner.       Influenza A by PCR NEGATIVE NEGATIVE Final   Influenza B by PCR NEGATIVE NEGATIVE Final    Comment: (NOTE) The Xpert Xpress SARS-CoV-2/FLU/RSV plus assay is intended as an aid in the diagnosis of influenza from Nasopharyngeal swab specimens and should not be used as a sole basis for treatment. Nasal washings and aspirates are unacceptable for Xpert Xpress SARS-CoV-2/FLU/RSV testing.  Fact Sheet for Patients: BloggerCourse.comhttps://www.fda.gov/media/152166/download  Fact Sheet for Healthcare Providers: SeriousBroker.ithttps://www.fda.gov/media/152162/download  This test is not yet approved or cleared by the [place] FDA and has been authorized for detection and/or diagnosis of SARS-CoV-2 by FDA under an Emergency Use Authorization (EUA). This EUA will remain in effect (meaning this test can be used) for the duration of the COVID-19 declaration under Section 564(b)(1) of the Act, 21 U.S.C. section 360bbb-3(b)(1), unless the authorization is terminated or revoked.  Performed at [facility], [address] [place], [place] [address]      Studies: MR 3D Recon At Scanner  Result Date: 04/15/2020 CLINICAL DATA:  Pancreatitis.  Liver lesion on ultrasound yesterday. EXAM: MRI ABDOMEN WITHOUT AND WITH CONTRAST (INCLUDING  MRCP) TECHNIQUE: Multiplanar multisequence MR imaging of the abdomen was performed both before and after the administration of intravenous contrast. Heavily T2-weighted images of the biliary and pancreatic ducts were obtained, and three-dimensional MRCP images were rendered by post processing. CONTRAST:  10mL GADAVIST GADOBUTROL 1 MMOL/ML IV SOLN COMPARISON:  Ultrasound exam 04/13/2020.  CT scan 07/28/2018. FINDINGS: Motion degraded study. Subtle or small lesions could be obscured by artifact. Lower chest: Unremarkable. Hepatobiliary: 2.6 cm subcapsular focus of fat deposition noted posterior segment IV, corresponding to the abnormality seen on ultrasound yesterday. No suspicious liver abnormality within the above-stated limitation. There is no evidence for gallstones, gallbladder wall thickening, or pericholecystic fluid. No intrahepatic or extrahepatic biliary dilation. MRCP images show no intra or extrahepatic biliary duct dilatation. Common bile duct measures 5 mm diameter. No choledocholithiasis. Pancreas: Peripancreatic edema noted around the tail of pancreas with intraparenchymal edema also noted in the pancreatic tail. Pancreatic parenchyma enhances throughout with no features to suggest necrosis. No rim enhancing or focal fluid collection to  suggest pseudocyst or abscess. There is an area of ill-defined low T1 and low T2 signal inferior to the tail of pancreas, indeterminate. Spleen:  No splenomegaly. No focal mass lesion. Adrenals/Urinary Tract: No adrenal nodule or mass. Postcontrast imaging of the kidneys is markedly degraded by motion. 11 mm T2 hyperintense lesion in the lower pole right kidney is likely a cyst. Possible small cysts in the central kidneys bilaterally. Stomach/Bowel: Stomach is unremarkable. No gastric wall thickening. No evidence of outlet obstruction. Duodenum is normally positioned as is the ligament of Treitz. No small bowel or colonic dilatation within the visualized abdomen.  Vascular/Lymphatic: No abdominal aortic aneurysm. No abdominal lymphadenopathy. Other:  No substantial intraperitoneal free fluid. Musculoskeletal: No focal suspicious marrow enhancement within the visualized bony anatomy. IMPRESSION: 1. Markedly motion degraded study. Subtle or small lesions could be obscured on this exam. 2. Edema in and around the tail of pancreas, compatible pancreatitis. Diffuse enhancement of pancreatic parenchyma evident without features to suggest pancreatic necrosis. No rim enhancing fluid collection to suggest pseudocyst or abscess. Imaging features are similar to the CT scan of 07/27/2018. 3. Area of ill-defined low T1 and low T2 signal inferior to the tail of pancreas the likely related to the inflammation but not well evaluated due to motion degradation of postcontrast imaging. Consider follow-up exam when patient is better able to participate in positioning and breath holding. Alternatively, pancreatic protocol CT abdomen/pelvis could be used to further evaluate given better temporal resolution. 4. No evidence for gallstones. No choledocholithiasis. No intra or extrahepatic biliary duct dilatation. 5. Hyperechoic abnormality identified in the liver on ultrasound exam yesterday represents focal subcapsular fatty deposition in posterior segment IV. 6. Probable small bilateral renal cysts. These could also be further assessed at the time of follow-up. Electronically Signed   By: Kennith Center M.D.   On: 04/15/2020 07:33   MR ABDOMEN MRCP W WO CONTAST  Result Date: 04/15/2020 CLINICAL DATA:  Pancreatitis.  Liver lesion on ultrasound yesterday. EXAM: MRI ABDOMEN WITHOUT AND WITH CONTRAST (INCLUDING MRCP) TECHNIQUE: Multiplanar multisequence MR imaging of the abdomen was performed both before and after the administration of intravenous contrast. Heavily T2-weighted images of the biliary and pancreatic ducts were obtained, and three-dimensional MRCP images were rendered by post processing.  CONTRAST:  10mL GADAVIST GADOBUTROL 1 MMOL/ML IV SOLN COMPARISON:  Ultrasound exam 04/13/2020.  CT scan 07/28/2018. FINDINGS: Motion degraded study. Subtle or small lesions could be obscured by artifact. Lower chest: Unremarkable. Hepatobiliary: 2.6 cm subcapsular focus of fat deposition noted posterior segment IV, corresponding to the abnormality seen on ultrasound yesterday. No suspicious liver abnormality within the above-stated limitation. There is no evidence for gallstones, gallbladder wall thickening, or pericholecystic fluid. No intrahepatic or extrahepatic biliary dilation. MRCP images show no intra or extrahepatic biliary duct dilatation. Common bile duct measures 5 mm diameter. No choledocholithiasis. Pancreas: Peripancreatic edema noted around the tail of pancreas with intraparenchymal edema also noted in the pancreatic tail. Pancreatic parenchyma enhances throughout with no features to suggest necrosis. No rim enhancing or focal fluid collection to suggest pseudocyst or abscess. There is an area of ill-defined low T1 and low T2 signal inferior to the tail of pancreas, indeterminate. Spleen:  No splenomegaly. No focal mass lesion. Adrenals/Urinary Tract: No adrenal nodule or mass. Postcontrast imaging of the kidneys is markedly degraded by motion. 11 mm T2 hyperintense lesion in the lower pole right kidney is likely a cyst. Possible small cysts in the central kidneys bilaterally. Stomach/Bowel: Stomach is unremarkable. No  gastric wall thickening. No evidence of outlet obstruction. Duodenum is normally positioned as is the ligament of Treitz. No small bowel or colonic dilatation within the visualized abdomen. Vascular/Lymphatic: No abdominal aortic aneurysm. No abdominal lymphadenopathy. Other:  No substantial intraperitoneal free fluid. Musculoskeletal: No focal suspicious marrow enhancement within the visualized bony anatomy. IMPRESSION: 1. Markedly motion degraded study. Subtle or small lesions could  be obscured on this exam. 2. Edema in and around the tail of pancreas, compatible pancreatitis. Diffuse enhancement of pancreatic parenchyma evident without features to suggest pancreatic necrosis. No rim enhancing fluid collection to suggest pseudocyst or abscess. Imaging features are similar to the CT scan of 07/27/2018. 3. Area of ill-defined low T1 and low T2 signal inferior to the tail of pancreas the likely related to the inflammation but not well evaluated due to motion degradation of postcontrast imaging. Consider follow-up exam when patient is better able to participate in positioning and breath holding. Alternatively, pancreatic protocol CT abdomen/pelvis could be used to further evaluate given better temporal resolution. 4. No evidence for gallstones. No choledocholithiasis. No intra or extrahepatic biliary duct dilatation. 5. Hyperechoic abnormality identified in the liver on ultrasound exam yesterday represents focal subcapsular fatty deposition in posterior segment IV. 6. Probable small bilateral renal cysts. These could also be further assessed at the time of follow-up. Electronically Signed   By: Kennith Center M.D.   On: 04/15/2020 07:33   US ABDOMEN LIMITED RUQ (LIVER/GB)  Result Date: 04/13/2020 CLINICAL DATA:  Abdominal pain for 10 days EXAM: ULTRASOUND ABDOMEN LIMITED RIGHT UPPER QUADRANT COMPARISON:  07/27/2018 FINDINGS: Gallbladder: No gallstones or wall thickening visualized. No sonographic Murphy sign noted by sonographer. Common bile duct: Diameter: 4 mm Liver: 2.9 x 1.7 x 2.6 cm hypoechoic structure within the right lobe liver is indeterminate, likely reflecting hemangioma. The remainder of the liver parenchyma is unremarkable. No biliary duct dilation. Portal vein is patent on color Doppler imaging with normal direction of blood flow towards the liver. Other: None. IMPRESSION: 1. 2.9 cm hyperechoic mass within the right lobe liver, likely a hemangioma. Nonemergent follow-up dedicated  liver MRI could be performed for confirmation. 2. Otherwise unremarkable right upper quadrant ultrasound. No evidence of cholelithiasis or cholecystitis. Electronically Signed   By: Sharlet Salina M.D.   On: 04/13/2020 19:20    Scheduled Meds: . amLODipine  10 mg Oral Daily  . enoxaparin (LOVENOX) injection  80 mg Subcutaneous Q24H  . insulin aspart  0-15 Units Subcutaneous TID WC  . insulin glargine  5 Units Subcutaneous QHS  . metoprolol tartrate  100 mg Oral BID  . polyethylene glycol  17 g Oral Daily  . sodium chloride flush  3 mL Intravenous Q12H   Continuous Infusions: . lactated ringers 100 mL/hr at 04/15/20 8299    Assessment/Plan:  1. Acute pancreatitis. Still unclear etiology. The patient does not drink any alcohol. Her triglycerides are normal. Imaging of the gallbladder are unremarkable. Patient does take hydrochlorothiazide and Lasix which could cause pancreatitis and these are held. MRCP of the abdomen shows inflammation of the pancreas. Case discussed with Dr. Hazle Quant surgery and he would like to observe right now before deciding on taking of the gallbladder. Case discussed with Dr. Servando Snare gastroenterology and recommended sending off an ANA and an IgG4 to rule out autoimmune. Advance to full liquid diet. Add oral pain control medications. Potentially could be microlithiasis from the gallbladder causing acute pancreatitis. 2. Essential hypertension on Norvasc and metoprolol. Holding water pills. 3. Type 2 diabetes  mellitus holding glipizide and on low-dose glargine insulin. 4. Dehydration. IV fluids improved creatinine down to 1.0 5. Morbid obesity with a BMI of 65.94 6. Hypokalemia. Potassium replaced orally.     Code Status:     Code Status Orders  (From admission, onward)         Start     Ordered   04/13/20 2133  Full code  Continuous        04/13/20 2137        Code Status History    Date Active Date Inactive Code Status Order ID Comments User Context    07/28/2018 0902 07/30/2018 2012 Full Code [id number]  Arnaldo Natal, MD Inpatient   Advance Care Planning Activity     Family Communication: Family at the bedside. Disposition Plan: Status is: Inpatient  Dispo: The patient is from: Home              Anticipated d/c is to: Home in a few days              Patient currently still with abdominal pain with acute pancreatitis requiring IV fluids and IV pain medications   Difficult to place patient. No.  Time spent: 28 minutes  [employer]

## 2020-04-15 NOTE — Progress Notes (Signed)
Mobility Specialist - Progress Note   04/15/20 1700  Mobility  Activity Ambulated in room  Level of Assistance Independent  Assistive Device None  Distance Ambulated (ft) 25 ft  Mobility Response Tolerated well  Mobility performed by Mobility specialist  $Mobility charge 1 Mobility    Pt was lying in bed upon arrival utilizing room air. Pt agreed to session. Pt denied nausea and fatigue, but does c/o pain in abdomen "7/10". Pt is independent in all transfers this date, including ambulation. Pt able to exit bed with extra time. No reports of dizziness. Pt ambulated 25' in room with no LOB noted. Pt sat in chair as mobility provided new bed chucks. Pt able to stand from chair without physical assistance. Pt denied SOB. O2 difficult to determine d/t long nails. Pt denied exhaustion. Overall, pt tolerated session well. Pt was left in bed with all needs in reach.    Filiberto Pinks Mobility Specialist 04/15/20, 5:23 PM

## 2020-04-16 DIAGNOSIS — K5900 Constipation, unspecified: Secondary | ICD-10-CM

## 2020-04-16 DIAGNOSIS — F5089 Other specified eating disorder: Secondary | ICD-10-CM | POA: Diagnosis not present

## 2020-04-16 DIAGNOSIS — K859 Acute pancreatitis without necrosis or infection, unspecified: Secondary | ICD-10-CM | POA: Diagnosis not present

## 2020-04-16 DIAGNOSIS — I10 Essential (primary) hypertension: Secondary | ICD-10-CM | POA: Diagnosis not present

## 2020-04-16 LAB — CBC
HCT: 44.8 % (ref 36.0–46.0)
Hemoglobin: 14.9 g/dL (ref 12.0–15.0)
MCH: 28.8 pg (ref 26.0–34.0)
MCHC: 33.3 g/dL (ref 30.0–36.0)
MCV: 86.7 fL (ref 80.0–100.0)
Platelets: 397 10*3/uL (ref 150–400)
RBC: 5.17 MIL/uL — ABNORMAL HIGH (ref 3.87–5.11)
RDW: 14.6 % (ref 11.5–15.5)
WBC: 9.5 10*3/uL (ref 4.0–10.5)
nRBC: 0 % (ref 0.0–0.2)

## 2020-04-16 LAB — COMPREHENSIVE METABOLIC PANEL
ALT: 22 U/L (ref 0–44)
AST: 21 U/L (ref 15–41)
Albumin: 3.5 g/dL (ref 3.5–5.0)
Alkaline Phosphatase: 59 U/L (ref 38–126)
Anion gap: 9 (ref 5–15)
BUN: 6 mg/dL (ref 6–20)
CO2: 26 mmol/L (ref 22–32)
Calcium: 9 mg/dL (ref 8.9–10.3)
Chloride: 99 mmol/L (ref 98–111)
Creatinine, Ser: 0.96 mg/dL (ref 0.44–1.00)
GFR, Estimated: >60 mL/min (ref >60)
Glucose, Bld: 190 mg/dL — ABNORMAL HIGH (ref 70–99)
Potassium: 3.6 mmol/L (ref 3.5–5.1)
Sodium: 134 mmol/L — ABNORMAL LOW (ref 135–145)
Total Bilirubin: 0.7 mg/dL (ref 0.3–1.2)
Total Protein: 7.1 g/dL (ref 6.5–8.1)

## 2020-04-16 LAB — LIPASE, BLOOD: Lipase: 177 U/L — ABNORMAL HIGH (ref 11–51)

## 2020-04-16 LAB — GLUCOSE, CAPILLARY
Glucose-Capillary: 181 mg/dL — ABNORMAL HIGH (ref 70–99)
Glucose-Capillary: 182 mg/dL — ABNORMAL HIGH (ref 70–99)
Glucose-Capillary: 188 mg/dL — ABNORMAL HIGH (ref 70–99)
Glucose-Capillary: 215 mg/dL — ABNORMAL HIGH (ref 70–99)

## 2020-04-16 LAB — FERRITIN: Ferritin: 169 ng/mL (ref 11–307)

## 2020-04-16 MED ORDER — LACTULOSE 10 GM/15ML PO SOLN
30.0000 g | Freq: Two times a day (BID) | ORAL | Status: DC
  Administered 2020-04-16: 30 g via ORAL
  Filled 2020-04-16: qty 60

## 2020-04-16 MED ORDER — LACTULOSE 10 GM/15ML PO SOLN
30.0000 g | Freq: Three times a day (TID) | ORAL | Status: DC
  Administered 2020-04-17: 30 g via ORAL
  Filled 2020-04-16: qty 60

## 2020-04-16 MED ORDER — LACTATED RINGERS IV SOLN: INTRAVENOUS | Status: DC

## 2020-04-16 MED ORDER — POLYETHYLENE GLYCOL 3350 17 G PO PACK: 17.0000 g | PACK | Freq: Two times a day (BID) | ORAL | Status: DC

## 2020-04-16 MED ORDER — MAGNESIUM CITRATE PO SOLN
1.0000 | Freq: Once | ORAL | Status: DC | PRN
  Filled 2020-04-16: qty 296

## 2020-04-16 NOTE — Progress Notes (Signed)
Patient ID: Tamara Higgins, female   DOB: Apr 02, 1975, 45 y.o.   MRN: [id number] [facility] PROGRESS NOTE  Tamara Higgins [id number] DOB: 04/06/1975 DOA: 04/13/2020 PCP: Patient, No Pcp Per  HPI/Subjective: Patient today shared that she craves ice and dirt.  Patient admitted with pancreatitis.  Still having abdominal pain but a little bit better.  Lipase a little higher today.  Objective: Vitals:   04/16/20 1103 04/16/20 1531  BP: (!) 147/74 123/65  Pulse: 66 (!) 59  Resp: 16 16  Temp: 98.7 F (37.1 C) 98.3 F (36.8 C)  SpO2: 96% 98%    Intake/Output Summary (Last 24 hours) at 04/16/2020 1559 Last data filed at 04/16/2020 1515 Gross per 24 hour  Intake 1428.56 ml  Output --  Net 1428.56 ml   Filed Weights   04/14/20 0600  Weight: (!) 163.5 kg    ROS: Review of Systems  Respiratory: Negative for shortness of breath.   Cardiovascular: Negative for chest pain.  Gastrointestinal: Positive for abdominal pain. Negative for nausea and vomiting.   Exam: Physical Exam HENT:     Head: Normocephalic.     Mouth/Throat:     Pharynx: No oropharyngeal exudate.  Eyes:     General: Lids are normal.     Conjunctiva/sclera: Conjunctivae normal.     Pupils: Pupils are equal, round, and reactive to light.  Cardiovascular:     Rate and Rhythm: Normal rate and regular rhythm.     Heart sounds: Normal heart sounds, S1 normal and S2 normal.  Pulmonary:     Breath sounds: Normal breath sounds. No decreased breath sounds, wheezing, rhonchi or rales.  Abdominal:     Palpations: Abdomen is soft.     Tenderness: There is abdominal tenderness in the epigastric area.  Musculoskeletal:     Right ankle: No swelling.     Left ankle: No swelling.  Skin:    General: Skin is warm.     Findings: No rash.  Neurological:     Mental Status: She is alert and oriented to person, place, and time.       Data Reviewed: Basic Metabolic Panel: Recent Labs  Lab 04/13/20 1512 04/14/20 0457  04/15/20 0547 04/16/20 0439  NA 131* 132* 134* 134*  K 3.3* 3.4* 3.7 3.6  CL 97* 99 101 99  CO2 25 21* 25 26  GLUCOSE 191* 176* 168* 190*  BUN 11 11 8 6   CREATININE 1.22* 1.17* 1.00 0.96  CALCIUM 8.9 8.7* 8.9 9.0  MG 1.9  --  1.5*  --    Liver Function Tests: Recent Labs  Lab 04/13/20 1512 04/15/20 0547 04/16/20 0439  AST 29 25 21   ALT 21 24 22   ALKPHOS 58 56 59  BILITOT 0.7 0.7 0.7  PROT 7.6 7.0 7.1  ALBUMIN 3.5 3.2* 3.5   Recent Labs  Lab 04/13/20 1512 04/14/20 0457 04/15/20 0547 04/16/20 0439  LIPASE 97* 80* 104* 177*   CBC: Recent Labs  Lab 04/13/20 1512 04/14/20 0457 04/15/20 0722 04/16/20 0439  WBC 12.1* 12.7* 9.0 9.5  HGB 15.5* 14.5 14.1 14.9  HCT 46.1* 44.1 41.4 44.8  MCV 85.8 86.1 85.9 86.7  PLT 422* 368 393 397    CBG: Recent Labs  Lab 04/15/20 1153 04/15/20 1643 04/15/20 2146 04/16/20 0743 04/16/20 1145  GLUCAP 189* 203* 194* 181* 182*    Recent Results (from the past 240 hour(s))  Resp Panel by RT-PCR (Flu A&B, Covid) Nasopharyngeal Swab     Status: None  Collection Time: 04/13/20  6:40 PM   Specimen: Nasopharyngeal Swab; Nasopharyngeal(NP) swabs in vial transport medium  Result Value Ref Range Status   SARS Coronavirus 2 by RT PCR NEGATIVE NEGATIVE Final    Comment: (NOTE) SARS-CoV-2 target nucleic acids are NOT DETECTED.  The SARS-CoV-2 RNA is generally detectable in upper respiratory specimens during the acute phase of infection. The lowest concentration of SARS-CoV-2 viral copies this assay can detect is 138 copies/mL. A negative result does not preclude SARS-Cov-2 infection and should not be used as the sole basis for treatment or other patient management decisions. A negative result may occur with  improper specimen collection/handling, submission of specimen other than nasopharyngeal swab, presence of viral mutation(s) within the areas targeted by this assay, and inadequate number of viral copies(<138 copies/mL). A  negative result must be combined with clinical observations, patient history, and epidemiological information. The expected result is Negative.  Fact Sheet for Patients:  BloggerCourse.com  Fact Sheet for Healthcare Providers:  SeriousBroker.it  This test is no t yet approved or cleared by the [place] FDA and  has been authorized for detection and/or diagnosis of SARS-CoV-2 by FDA under an Emergency Use Authorization (EUA). This EUA will remain  in effect (meaning this test can be used) for the duration of the COVID-19 declaration under Section 564(b)(1) of the Act, 21 U.S.C.section 360bbb-3(b)(1), unless the authorization is terminated  or revoked sooner.       Influenza A by PCR NEGATIVE NEGATIVE Final   Influenza B by PCR NEGATIVE NEGATIVE Final    Comment: (NOTE) The Xpert Xpress SARS-CoV-2/FLU/RSV plus assay is intended as an aid in the diagnosis of influenza from Nasopharyngeal swab specimens and should not be used as a sole basis for treatment. Nasal washings and aspirates are unacceptable for Xpert Xpress SARS-CoV-2/FLU/RSV testing.  Fact Sheet for Patients: BloggerCourse.com  Fact Sheet for Healthcare Providers: SeriousBroker.it  This test is not yet approved or cleared by the [place] FDA and has been authorized for detection and/or diagnosis of SARS-CoV-2 by FDA under an Emergency Use Authorization (EUA). This EUA will remain in effect (meaning this test can be used) for the duration of the COVID-19 declaration under Section 564(b)(1) of the Act, 21 U.S.C. section 360bbb-3(b)(1), unless the authorization is terminated or revoked.  Performed at [facility], [address], [place], [place] [address]      Studies: MR 3D Recon At Scanner  Result Date: 04/15/2020 CLINICAL DATA:  Pancreatitis.  Liver lesion on ultrasound yesterday. EXAM: MRI  ABDOMEN WITHOUT AND WITH CONTRAST (INCLUDING MRCP) TECHNIQUE: Multiplanar multisequence MR imaging of the abdomen was performed both before and after the administration of intravenous contrast. Heavily T2-weighted images of the biliary and pancreatic ducts were obtained, and three-dimensional MRCP images were rendered by post processing. CONTRAST:  10mL GADAVIST GADOBUTROL 1 MMOL/ML IV SOLN COMPARISON:  Ultrasound exam 04/13/2020.  CT scan 07/28/2018. FINDINGS: Motion degraded study. Subtle or small lesions could be obscured by artifact. Lower chest: Unremarkable. Hepatobiliary: 2.6 cm subcapsular focus of fat deposition noted posterior segment IV, corresponding to the abnormality seen on ultrasound yesterday. No suspicious liver abnormality within the above-stated limitation. There is no evidence for gallstones, gallbladder wall thickening, or pericholecystic fluid. No intrahepatic or extrahepatic biliary dilation. MRCP images show no intra or extrahepatic biliary duct dilatation. Common bile duct measures 5 mm diameter. No choledocholithiasis. Pancreas: Peripancreatic edema noted around the tail of pancreas with intraparenchymal edema also noted in the pancreatic tail. Pancreatic parenchyma enhances  throughout with no features to suggest necrosis. No rim enhancing or focal fluid collection to suggest pseudocyst or abscess. There is an area of ill-defined low T1 and low T2 signal inferior to the tail of pancreas, indeterminate. Spleen:  No splenomegaly. No focal mass lesion. Adrenals/Urinary Tract: No adrenal nodule or mass. Postcontrast imaging of the kidneys is markedly degraded by motion. 11 mm T2 hyperintense lesion in the lower pole right kidney is likely a cyst. Possible small cysts in the central kidneys bilaterally. Stomach/Bowel: Stomach is unremarkable. No gastric wall thickening. No evidence of outlet obstruction. Duodenum is normally positioned as is the ligament of Treitz. No small bowel or colonic  dilatation within the visualized abdomen. Vascular/Lymphatic: No abdominal aortic aneurysm. No abdominal lymphadenopathy. Other:  No substantial intraperitoneal free fluid. Musculoskeletal: No focal suspicious marrow enhancement within the visualized bony anatomy. IMPRESSION: 1. Markedly motion degraded study. Subtle or small lesions could be obscured on this exam. 2. Edema in and around the tail of pancreas, compatible pancreatitis. Diffuse enhancement of pancreatic parenchyma evident without features to suggest pancreatic necrosis. No rim enhancing fluid collection to suggest pseudocyst or abscess. Imaging features are similar to the CT scan of 07/27/2018. 3. Area of ill-defined low T1 and low T2 signal inferior to the tail of pancreas the likely related to the inflammation but not well evaluated due to motion degradation of postcontrast imaging. Consider follow-up exam when patient is better able to participate in positioning and breath holding. Alternatively, pancreatic protocol CT abdomen/pelvis could be used to further evaluate given better temporal resolution. 4. No evidence for gallstones. No choledocholithiasis. No intra or extrahepatic biliary duct dilatation. 5. Hyperechoic abnormality identified in the liver on ultrasound exam yesterday represents focal subcapsular fatty deposition in posterior segment IV. 6. Probable small bilateral renal cysts. These could also be further assessed at the time of follow-up. Electronically Signed   By: Kennith Center M.D.   On: 04/15/2020 07:33   MR ABDOMEN MRCP W WO CONTAST  Result Date: 04/15/2020 CLINICAL DATA:  Pancreatitis.  Liver lesion on ultrasound yesterday. EXAM: MRI ABDOMEN WITHOUT AND WITH CONTRAST (INCLUDING MRCP) TECHNIQUE: Multiplanar multisequence MR imaging of the abdomen was performed both before and after the administration of intravenous contrast. Heavily T2-weighted images of the biliary and pancreatic ducts were obtained, and three-dimensional  MRCP images were rendered by post processing. CONTRAST:  64mL GADAVIST GADOBUTROL 1 MMOL/ML IV SOLN COMPARISON:  Ultrasound exam 04/13/2020.  CT scan 07/28/2018. FINDINGS: Motion degraded study. Subtle or small lesions could be obscured by artifact. Lower chest: Unremarkable. Hepatobiliary: 2.6 cm subcapsular focus of fat deposition noted posterior segment IV, corresponding to the abnormality seen on ultrasound yesterday. No suspicious liver abnormality within the above-stated limitation. There is no evidence for gallstones, gallbladder wall thickening, or pericholecystic fluid. No intrahepatic or extrahepatic biliary dilation. MRCP images show no intra or extrahepatic biliary duct dilatation. Common bile duct measures 5 mm diameter. No choledocholithiasis. Pancreas: Peripancreatic edema noted around the tail of pancreas with intraparenchymal edema also noted in the pancreatic tail. Pancreatic parenchyma enhances throughout with no features to suggest necrosis. No rim enhancing or focal fluid collection to suggest pseudocyst or abscess. There is an area of ill-defined low T1 and low T2 signal inferior to the tail of pancreas, indeterminate. Spleen:  No splenomegaly. No focal mass lesion. Adrenals/Urinary Tract: No adrenal nodule or mass. Postcontrast imaging of the kidneys is markedly degraded by motion. 11 mm T2 hyperintense lesion in the lower pole right kidney is likely  a cyst. Possible small cysts in the central kidneys bilaterally. Stomach/Bowel: Stomach is unremarkable. No gastric wall thickening. No evidence of outlet obstruction. Duodenum is normally positioned as is the ligament of Treitz. No small bowel or colonic dilatation within the visualized abdomen. Vascular/Lymphatic: No abdominal aortic aneurysm. No abdominal lymphadenopathy. Other:  No substantial intraperitoneal free fluid. Musculoskeletal: No focal suspicious marrow enhancement within the visualized bony anatomy. IMPRESSION: 1. Markedly motion  degraded study. Subtle or small lesions could be obscured on this exam. 2. Edema in and around the tail of pancreas, compatible pancreatitis. Diffuse enhancement of pancreatic parenchyma evident without features to suggest pancreatic necrosis. No rim enhancing fluid collection to suggest pseudocyst or abscess. Imaging features are similar to the CT scan of 07/27/2018. 3. Area of ill-defined low T1 and low T2 signal inferior to the tail of pancreas the likely related to the inflammation but not well evaluated due to motion degradation of postcontrast imaging. Consider follow-up exam when patient is better able to participate in positioning and breath holding. Alternatively, pancreatic protocol CT abdomen/pelvis could be used to further evaluate given better temporal resolution. 4. No evidence for gallstones. No choledocholithiasis. No intra or extrahepatic biliary duct dilatation. 5. Hyperechoic abnormality identified in the liver on ultrasound exam yesterday represents focal subcapsular fatty deposition in posterior segment IV. 6. Probable small bilateral renal cysts. These could also be further assessed at the time of follow-up. Electronically Signed   By: Kennith Center M.D.   On: 04/15/2020 07:33    Scheduled Meds: . amLODipine  10 mg Oral Daily  . enoxaparin (LOVENOX) injection  80 mg Subcutaneous Q24H  . insulin aspart  0-15 Units Subcutaneous TID WC  . insulin glargine  5 Units Subcutaneous QHS  . lactulose  30 g Oral TID  . metoprolol tartrate  100 mg Oral BID  . polyethylene glycol  17 g Oral BID  . sodium chloride flush  3 mL Intravenous Q12H   Continuous Infusions: . lactated ringers 50 mL/hr at 04/16/20 1515    Assessment/Plan:  1. Acute pancreatitis.  Still unclear etiology.  Patient's lipase is a little higher today.  Continue full liquid diet.  Continue to hold hydrochlorothiazide, Lasix and glipizide.  I sent off an IgG4 and an ANA.  We will have to follow-up with GI and general  surgery as outpatient.  All other causes of acute pancreatitis will need to be ruled out before General surgery will take her gallbladder out. 2. Constipation increase MiraLAX to twice daily dosing increase lactulose to 3 times daily dosing. 3. Pica.  The patient eats ice and dirt.  Advised not to eat dirt and ice.  Ferritin 169 and patient is not anemic at hemoglobin 14.9 4. Essential hypertension on Norvasc and metoprolol 5. Type 2 diabetes mellitus.  Continue to hold glipizide.  On low-dose glargine insulin.  We will have to choose another diabetic pill upon disposition. 6. Morbid obesity BMI 65.94 7. Hypokalemia.  Replaced in the normal range        Code Status:     Code Status Orders  (From admission, onward)         Start     Ordered   04/13/20 2133  Full code  Continuous        04/13/20 2137        Code Status History    Date Active Date Inactive Code Status Order ID Comments User Context   07/28/2018 0902 07/30/2018 2012 Full Code [id number]  Joycelyn Rua  S, MD Inpatient   Advance Care Planning Activity     Disposition Plan: Status is: Inpatient  Dispo: The patient is from: Home              Anticipated d/c is to: Home              Patient currently with lipase increasing today we will continue full liquid diet and watch further here in the hospital.   Difficult to place patient.  No  Time spent: 28 minutes  Taysia Rivere [employer]  [facility]

## 2020-04-17 DIAGNOSIS — F5089 Other specified eating disorder: Secondary | ICD-10-CM | POA: Diagnosis not present

## 2020-04-17 DIAGNOSIS — K5900 Constipation, unspecified: Secondary | ICD-10-CM | POA: Diagnosis not present

## 2020-04-17 DIAGNOSIS — I10 Essential (primary) hypertension: Secondary | ICD-10-CM | POA: Diagnosis not present

## 2020-04-17 DIAGNOSIS — K859 Acute pancreatitis without necrosis or infection, unspecified: Secondary | ICD-10-CM | POA: Diagnosis not present

## 2020-04-17 LAB — COMPREHENSIVE METABOLIC PANEL
ALT: 19 U/L (ref 0–44)
AST: 18 U/L (ref 15–41)
Albumin: 3.4 g/dL — ABNORMAL LOW (ref 3.5–5.0)
Alkaline Phosphatase: 59 U/L (ref 38–126)
Anion gap: 7 (ref 5–15)
BUN: <5 mg/dL — ABNORMAL LOW (ref 6–20)
CO2: 28 mmol/L (ref 22–32)
Calcium: 9 mg/dL (ref 8.9–10.3)
Chloride: 99 mmol/L (ref 98–111)
Creatinine, Ser: 1.04 mg/dL — ABNORMAL HIGH (ref 0.44–1.00)
GFR, Estimated: >60 mL/min (ref >60)
Glucose, Bld: 192 mg/dL — ABNORMAL HIGH (ref 70–99)
Potassium: 3.6 mmol/L (ref 3.5–5.1)
Sodium: 134 mmol/L — ABNORMAL LOW (ref 135–145)
Total Bilirubin: 0.8 mg/dL (ref 0.3–1.2)
Total Protein: 7 g/dL (ref 6.5–8.1)

## 2020-04-17 LAB — ENA+DNA/DS+ANTICH+CENTRO+JO...
Anti JO-1: <0.2 AI (ref 0.0–0.9)
Centromere Ab Screen: <0.2 AI (ref 0.0–0.9)
Chromatin Ab SerPl-aCnc: <0.2 AI (ref 0.0–0.9)
ENA SM Ab Ser-aCnc: <0.2 AI (ref 0.0–0.9)
Ribonucleic Protein: 0.5 AI (ref 0.0–0.9)
SSA (Ro) (ENA) Antibody, IgG: <0.2 AI (ref 0.0–0.9)
SSB (La) (ENA) Antibody, IgG: <0.2 AI (ref 0.0–0.9)
Scleroderma (Scl-70) (ENA) Antibody, IgG: <0.2 AI (ref 0.0–0.9)
ds DNA Ab: 12 IU/mL — ABNORMAL HIGH (ref 0–9)

## 2020-04-17 LAB — LIPASE, BLOOD: Lipase: 164 U/L — ABNORMAL HIGH (ref 11–51)

## 2020-04-17 LAB — GLUCOSE, CAPILLARY
Glucose-Capillary: 190 mg/dL — ABNORMAL HIGH (ref 70–99)
Glucose-Capillary: 208 mg/dL — ABNORMAL HIGH (ref 70–99)
Glucose-Capillary: 205 mg/dL — ABNORMAL HIGH (ref 70–99)
Glucose-Capillary: 202 mg/dL — ABNORMAL HIGH (ref 70–99)

## 2020-04-17 LAB — MAGNESIUM: Magnesium: 1.6 mg/dL — ABNORMAL LOW (ref 1.7–2.4)

## 2020-04-17 LAB — IGG 4: IgG, Subclass 4: 15 mg/dL (ref 2–96)

## 2020-04-17 LAB — ANA W/REFLEX IF POSITIVE: Anti Nuclear Antibody (ANA): POSITIVE — AB

## 2020-04-17 MED ORDER — MAGNESIUM SULFATE 2 GM/50ML IV SOLN
2.0000 g | Freq: Once | INTRAVENOUS | Status: AC
  Administered 2020-04-17: 2 g via INTRAVENOUS
  Filled 2020-04-17: qty 50

## 2020-04-17 MED ORDER — PANCRELIPASE (LIP-PROT-AMYL) 12000-38000 UNITS PO CPEP
72000.0000 [IU] | ORAL_CAPSULE | Freq: Three times a day (TID) | ORAL | Status: DC
  Administered 2020-04-17 – 2020-04-18 (×3): 72000 [IU] via ORAL
  Filled 2020-04-17 (×5): qty 6

## 2020-04-17 MED ORDER — LACTULOSE 10 GM/15ML PO SOLN
30.0000 g | Freq: Two times a day (BID) | ORAL | Status: DC
  Administered 2020-04-17 – 2020-04-18 (×2): 30 g via ORAL
  Filled 2020-04-17 (×2): qty 60

## 2020-04-17 MED ORDER — POLYETHYLENE GLYCOL 3350 17 G PO PACK
17.0000 g | PACK | Freq: Every day | ORAL | Status: DC
  Administered 2020-04-18: 17 g via ORAL
  Filled 2020-04-17: qty 1

## 2020-04-17 MED ORDER — MAGNESIUM OXIDE 400 (241.3 MG) MG PO TABS
400.0000 mg | ORAL_TABLET | Freq: Every day | ORAL | Status: DC
  Administered 2020-04-17: 400 mg via ORAL
  Filled 2020-04-17: qty 1

## 2020-04-17 MED ORDER — POLYETHYLENE GLYCOL 3350 17 G PO PACK
17.0000 g | PACK | Freq: Four times a day (QID) | ORAL | Status: DC
  Administered 2020-04-17: 17 g via ORAL
  Filled 2020-04-17: qty 1

## 2020-04-17 NOTE — Progress Notes (Signed)
Patient ID: Tamara Higgins, female   DOB: [birth year], 45 y.o.   MRN: [id number] [facility] PROGRESS NOTE  Tamara Higgins KXF:[id number] DOB: 1976/02/07 DOA: 04/13/2020 PCP: Patient, No Pcp Per  HPI/Subjective: Patient had multiple bowel movements yesterday.  Feeling better today than yesterday still does have some abdominal pain.  Tolerating liquid diet okay.  Interest advancing to solid food.  Objective: Vitals:   04/17/20 0812 04/17/20 1147  BP: (!) 155/75 (!) 155/60  Pulse: 69 65  Resp: 20 16  Temp:  98.3 F (36.8 C)  SpO2: 93% 93%    Intake/Output Summary (Last 24 hours) at 04/17/2020 1420 Last data filed at 04/17/2020 1035 Gross per 24 hour  Intake 1248.56 ml  Output -  Net 1248.56 ml   Filed Weights   04/14/20 0600 04/17/20 0559  Weight: (!) 163.5 kg (!) 158.3 kg    ROS: Review of Systems  Respiratory: Negative for shortness of breath.   Cardiovascular: Negative for chest pain.  Gastrointestinal: Positive for abdominal pain. Negative for nausea and vomiting.   Exam: Physical Exam HENT:     Head: Normocephalic.     Mouth/Throat:     Pharynx: No oropharyngeal exudate.  Eyes:     General: Lids are normal.     Conjunctiva/sclera: Conjunctivae normal.     Pupils: Pupils are equal, round, and reactive to light.  Cardiovascular:     Rate and Rhythm: Normal rate and regular rhythm.     Heart sounds: Normal heart sounds, S1 normal and S2 normal.  Pulmonary:     Breath sounds: Normal breath sounds. No decreased breath sounds, wheezing, rhonchi or rales.  Abdominal:     Palpations: Abdomen is soft.     Tenderness: There is abdominal tenderness in the epigastric area.  Musculoskeletal:     Right lower leg: Swelling present.     Left lower leg: Swelling present.  Skin:    General: Skin is warm.     Findings: No rash.  Neurological:     Mental Status: She is alert and oriented to person, place, and time.       Data Reviewed: Basic Metabolic Panel: Recent  Labs  Lab 04/13/20 1512 04/14/20 0457 04/15/20 0547 04/16/20 0439 04/17/20 0413  NA 131* 132* 134* 134* 134*  K 3.3* 3.4* 3.7 3.6 3.6  CL 97* 99 101 99 99  CO2 25 21* 25 26 28   GLUCOSE 191* 176* 168* 190* 192*  BUN 11 11 8 6  <5*  CREATININE 1.22* 1.17* 1.00 0.96 1.04*  CALCIUM 8.9 8.7* 8.9 9.0 9.0  MG 1.9  --  1.5*  --  1.6*   Liver Function Tests: Recent Labs  Lab 04/13/20 1512 04/15/20 0547 04/16/20 0439 04/17/20 0413  AST 29 25 21 18   ALT 21 24 22 19   ALKPHOS 58 56 59 59  BILITOT 0.7 0.7 0.7 0.8  PROT 7.6 7.0 7.1 7.0  ALBUMIN 3.5 3.2* 3.5 3.4*   Recent Labs  Lab 04/13/20 1512 04/14/20 0457 04/15/20 0547 04/16/20 0439 04/17/20 0413  LIPASE 97* 80* 104* 177* 164*   CBC: Recent Labs  Lab 04/13/20 1512 04/14/20 0457 04/15/20 0722 04/16/20 0439  WBC 12.1* 12.7* 9.0 9.5  HGB 15.5* 14.5 14.1 14.9  HCT 46.1* 44.1 41.4 44.8  MCV 85.8 86.1 85.9 86.7  PLT 422* 368 393 397    CBG: Recent Labs  Lab 04/16/20 1145 04/16/20 1639 04/16/20 2047 04/17/20 0818 04/17/20 1148  GLUCAP 182* 188* 215* 190* 208*  Recent Results (from the past 240 hour(s))  Resp Panel by RT-PCR (Flu A&B, Covid) Nasopharyngeal Swab     Status: None   Collection Time: 04/13/20  6:40 PM   Specimen: Nasopharyngeal Swab; Nasopharyngeal(NP) swabs in vial transport medium  Result Value Ref Range Status   SARS Coronavirus 2 by RT PCR NEGATIVE NEGATIVE Final    Comment: (NOTE) SARS-CoV-2 target nucleic acids are NOT DETECTED.  The SARS-CoV-2 RNA is generally detectable in upper respiratory specimens during the acute phase of infection. The lowest concentration of SARS-CoV-2 viral copies this assay can detect is 138 copies/mL. A negative result does not preclude SARS-Cov-2 infection and should not be used as the sole basis for treatment or other patient management decisions. A negative result may occur with  improper specimen collection/handling, submission of specimen other than  nasopharyngeal swab, presence of viral mutation(s) within the areas targeted by this assay, and inadequate number of viral copies(<138 copies/mL). A negative result must be combined with clinical observations, patient history, and epidemiological information. The expected result is Negative.  Fact Sheet for Patients:  BloggerCourse.com  Fact Sheet for Healthcare Providers:  SeriousBroker.it  This test is no t yet approved or cleared by the [place] FDA and  has been authorized for detection and/or diagnosis of SARS-CoV-2 by FDA under an Emergency Use Authorization (EUA). This EUA will remain  in effect (meaning this test can be used) for the duration of the COVID-19 declaration under Section 564(b)(1) of the Act, 21 U.S.C.section 360bbb-3(b)(1), unless the authorization is terminated  or revoked sooner.       Influenza A by PCR NEGATIVE NEGATIVE Final   Influenza B by PCR NEGATIVE NEGATIVE Final    Comment: (NOTE) The Xpert Xpress SARS-CoV-2/FLU/RSV plus assay is intended as an aid in the diagnosis of influenza from Nasopharyngeal swab specimens and should not be used as a sole basis for treatment. Nasal washings and aspirates are unacceptable for Xpert Xpress SARS-CoV-2/FLU/RSV testing.  Fact Sheet for Patients: BloggerCourse.com  Fact Sheet for Healthcare Providers: SeriousBroker.it  This test is not yet approved or cleared by the [place] FDA and has been authorized for detection and/or diagnosis of SARS-CoV-2 by FDA under an Emergency Use Authorization (EUA). This EUA will remain in effect (meaning this test can be used) for the duration of the COVID-19 declaration under Section 564(b)(1) of the Act, 21 U.S.C. section 360bbb-3(b)(1), unless the authorization is terminated or revoked.  Performed at [facility], [address] [place], [place]  [address]       Scheduled Meds: . amLODipine  10 mg Oral Daily  . enoxaparin (LOVENOX) injection  80 mg Subcutaneous Q24H  . insulin aspart  0-15 Units Subcutaneous TID WC  . insulin glargine  5 Units Subcutaneous QHS  . lactulose  30 g Oral BID  . lipase/protease/amylase  72,000 Units Oral TID AC  . magnesium oxide  400 mg Oral Daily  . metoprolol tartrate  100 mg Oral BID  . [START ON 04/18/2020] polyethylene glycol  17 g Oral Daily  . sodium chloride flush  3 mL Intravenous Q12H   Continuous Infusions: . lactated ringers 50 mL/hr at 04/16/20 2000    Assessment/Plan:  1. Acute pancreatitis.  Still unclear etiology.  Patient's lipase a little lower than yesterday but still more elevated than when she came in.  Advance to soft diet and recheck lipase again tomorrow.  Continue to hold hydrochlorothiazide, Lasix and glipizide all which can cause pancreatitis rarely.  Follow-up  with GI and general surgery as outpatient.  I sent off an IgG4 (normal range at 15) and ANA (still pending).  We will start cardiac enzymes. 2. Constipation.  This has resolved.  Continue lactulose usual dose and MiraLAX daily 3. pica.  Advised not to eat any ice or dirt.  Patient not anemic and iron stores okay. 4. Essential hypertension on Norvasc and metoprolol 5. type 2 diabetes mellitus.  Continue to hold glipizide.  Currently on low-dose glargine insulin. 6. Morbid obesity with BMI of 63.81 7. hypokalemia replaced into the normal range 8. hypomagnesemia replace magnesium orally and IV.  Orally may also help out with bowel movements.       Code Status:     Code Status Orders  (From admission, onward)         Start     Ordered   04/13/20 2133  Full code  Continuous        04/13/20 2137        Code Status History    Date Active Date Inactive Code Status Order ID Comments User Context   07/28/2018 0902 07/30/2018 2012 Full Code [id number]  Arnaldo Natal, MD Inpatient   Advance Care Planning  Activity     Disposition Plan: Status is: Inpatient  Dispo: The patient is from: Home              Anticipated d/c is to: Home              Patient currently will start on solid food diet today with pancreatic enzyme still elevated from when she came in I will recheck again tomorrow.   Difficult to place patient.  No.  Time spent: 28 minutes  [employer]

## 2020-04-18 DIAGNOSIS — F5089 Other specified eating disorder: Secondary | ICD-10-CM | POA: Diagnosis not present

## 2020-04-18 DIAGNOSIS — K5900 Constipation, unspecified: Secondary | ICD-10-CM | POA: Diagnosis not present

## 2020-04-18 DIAGNOSIS — K859 Acute pancreatitis without necrosis or infection, unspecified: Secondary | ICD-10-CM | POA: Diagnosis not present

## 2020-04-18 DIAGNOSIS — R768 Other specified abnormal immunological findings in serum: Secondary | ICD-10-CM

## 2020-04-18 LAB — BASIC METABOLIC PANEL
Anion gap: 9 (ref 5–15)
BUN: 7 mg/dL (ref 6–20)
CO2: 26 mmol/L (ref 22–32)
Calcium: 9 mg/dL (ref 8.9–10.3)
Chloride: 100 mmol/L (ref 98–111)
Creatinine, Ser: 1.02 mg/dL — ABNORMAL HIGH (ref 0.44–1.00)
GFR, Estimated: >60 mL/min (ref >60)
Glucose, Bld: 192 mg/dL — ABNORMAL HIGH (ref 70–99)
Potassium: 3.7 mmol/L (ref 3.5–5.1)
Sodium: 135 mmol/L (ref 135–145)

## 2020-04-18 LAB — MAGNESIUM: Magnesium: 1.8 mg/dL (ref 1.7–2.4)

## 2020-04-18 LAB — LIPASE, BLOOD: Lipase: 118 U/L — ABNORMAL HIGH (ref 11–51)

## 2020-04-18 LAB — GLUCOSE, CAPILLARY: Glucose-Capillary: 170 mg/dL — ABNORMAL HIGH (ref 70–99)

## 2020-04-18 MED ORDER — METFORMIN HCL 500 MG PO TABS
500.0000 mg | ORAL_TABLET | Freq: Two times a day (BID) | ORAL | Status: DC
  Administered 2020-04-18: 500 mg via ORAL
  Filled 2020-04-18: qty 1

## 2020-04-18 MED ORDER — METOPROLOL TARTRATE 100 MG PO TABS: 100.0000 mg | ORAL_TABLET | Freq: Two times a day (BID) | ORAL | 0 refills | Status: DC

## 2020-04-18 MED ORDER — MAGNESIUM OXIDE 400 (241.3 MG) MG PO TABS
400.0000 mg | ORAL_TABLET | Freq: Two times a day (BID) | ORAL | Status: DC
  Administered 2020-04-18: 400 mg via ORAL
  Filled 2020-04-18: qty 1

## 2020-04-18 MED ORDER — ONDANSETRON HCL 4 MG PO TABS: 4.0000 mg | ORAL_TABLET | Freq: Three times a day (TID) | ORAL | 0 refills | Status: AC | PRN

## 2020-04-18 MED ORDER — METFORMIN HCL 500 MG PO TABS: 500.0000 mg | ORAL_TABLET | Freq: Two times a day (BID) | ORAL | 0 refills | Status: AC

## 2020-04-18 MED ORDER — MAGNESIUM OXIDE 400 (241.3 MG) MG PO TABS: 400.0000 mg | ORAL_TABLET | Freq: Two times a day (BID) | ORAL | 0 refills | Status: AC

## 2020-04-18 MED ORDER — PANCRELIPASE (LIP-PROT-AMYL) 36000-114000 UNITS PO CPEP: 72000.0000 [IU] | ORAL_CAPSULE | Freq: Three times a day (TID) | ORAL | 0 refills | Status: AC

## 2020-04-18 MED ORDER — OXYCODONE HCL 5 MG PO TABS: 5.0000 mg | ORAL_TABLET | Freq: Four times a day (QID) | ORAL | 0 refills | Status: DC | PRN

## 2020-04-18 MED ORDER — POLYETHYLENE GLYCOL 3350 17 G PO PACK: 17.0000 g | PACK | Freq: Every day | ORAL | 0 refills | Status: AC | PRN

## 2020-04-18 NOTE — Discharge Summary (Signed)
[facility] NAME: Tamara Higgins    MR#:  [id number]  DATE OF BIRTH:  15-Aug-1975  DATE OF ADMISSION:  04/13/2020 ADMITTING PHYSICIAN: Loletha Grayer, MD  DATE OF DISCHARGE: 04/18/2020 11:32 AM  PRIMARY CARE PHYSICIAN: DR Sharyn Creamer   ADMISSION DIAGNOSIS:  Hypokalemia [E87.6] Pancreatitis [K85.90] AKI (acute kidney injury) ([place]) [N17.9] Abdominal pain [R10.9] Acute pancreatitis, unspecified complication status, unspecified pancreatitis type [K85.90]  DISCHARGE DIAGNOSIS:  Principal Problem:   Pancreatitis Active Problems:   COPD (chronic obstructive pulmonary disease) (HCC)   Diabetes ([place])   Essential hypertension   Class 3 severe obesity without serious comorbidity with body mass index (BMI) of 60.0 to 69.9 in adult [facility])   Constipation   Pica   Hypomagnesemia   SECONDARY DIAGNOSIS:   Past Medical History:  Diagnosis Date  . Arthritis   . COPD (chronic obstructive pulmonary disease) ([place])   . Diabetes mellitus without complication ([place])   . Hypertension   . Morbid obesity ([place])     HOSPITAL COURSE:   1.  Acute pancreatitis.  Still unclear etiology.  The patient's lipase has been slightly elevated the entire hospital stay.  The patient has improved clinically.  Able to tolerate diet.  Lipase 118 upon disposition.  I started pancreatic enzymes prior to meals.  Patient feeling a lot better with regards to her pain.  Continue to hold hydrochlorothiazide, Lasix and glipizide all which rarely can cause pancreatitis.  The patient's triglycerides normal range.  The patient does not drink alcohol.  I consulted general surgery to evaluate the gallbladder.  All of the imaging on the gallbladder turned back negative.  I did send off a IgG4 which is in the normal range of 15.  I sent off an ANA which was positive and also double-stranded DNA was positive.  I will refer to rheumatology as outpatient to see if the patient has lupus  (more criteria need to be met in order to make this diagnosis).  2 to 8% of patients with lupus can have pancreatitis.  We will have the patient also follow-up with GI and general surgery as outpatient.  MRI of the abdomen showed edema around the tail of the pancreas compatible with pancreatitis.  The hyper echoic abnormality identified on liver ultrasound represents focal loss of scapular fatty deposition on MRI. 2.  Constipation this has resolved.  Continue lactulose and as needed MiraLAX as outpatient. 3.  Pica.  Advised not to eat dirt or ice.  The patient is not anemic and her iron stores are okay. 4.  Essential hypertension.  Continue Norvasc and metoprolol.  Discontinue Lasix and hydrochlorothiazide. 5.  Type 2 diabetes mellitus.  Continue to hold glipizide.  We will start low-dose Metformin as outpatient. 6.  Morbid obesity with a BMI of 63.81. 7.  Hypokalemia.  This was replaced into the normal range. 8.  Hypomagnesemia.  We will continue to give oral magnesium twice a day as outpatient.  DISCHARGE CONDITIONS:   Satisfactory  CONSULTS OBTAINED:  General surgery  DRUG ALLERGIES:   Allergies  Allergen Reactions  . Ciprofloxacin Other (See Comments)    Body stiffens/ tightens     DISCHARGE MEDICATIONS:   Allergies as of 04/18/2020      Reactions   Ciprofloxacin Other (See Comments)   Body stiffens/ tightens       Medication List    STOP taking these medications   furosemide 20 MG tablet Commonly known as: LASIX  glipiZIDE 5 MG tablet Commonly known as: GLUCOTROL   hydrochlorothiazide 25 MG tablet Commonly known as: HYDRODIURIL   medroxyPROGESTERone 10 MG tablet Commonly known as: PROVERA   nicotine 21 mg/24hr patch Commonly known as: NICODERM CQ - dosed in mg/24 hours   norethindrone 5 MG tablet Commonly known as: AYGESTIN     TAKE these medications   acetaminophen 650 MG CR tablet Commonly known as: TYLENOL Take 650 mg by mouth every 8 (eight) hours as  needed for pain.   amLODipine 10 MG tablet Commonly known as: NORVASC Take 1 tablet (10 mg total) by mouth daily.   lactulose 10 GM/15ML solution Commonly known as: CHRONULAC Take 10 g by mouth 2 (two) times daily.   lipase/protease/amylase 36000 UNITS Cpep capsule Commonly known as: CREON Take 2 capsules (72,000 Units total) by mouth 3 (three) times daily before meals.   magnesium oxide 400 (241.3 Mg) MG tablet Commonly known as: MAG-OX Take 1 tablet (400 mg total) by mouth 2 (two) times daily.   metFORMIN 500 MG tablet Commonly known as: GLUCOPHAGE Take 1 tablet (500 mg total) by mouth 2 (two) times daily with a meal.   metoprolol tartrate 100 MG tablet Commonly known as: LOPRESSOR Take 1 tablet (100 mg total) by mouth 2 (two) times daily. What changed: how much to take   ondansetron 4 MG tablet Commonly known as: Zofran Take 1 tablet (4 mg total) by mouth every 8 (eight) hours as needed for nausea or vomiting.   oxyCODONE 5 MG immediate release tablet Commonly known as: Oxy IR/ROXICODONE Take 1 tablet (5 mg total) by mouth every 6 (six) hours as needed.   polyethylene glycol 17 g packet Commonly known as: MIRALAX / GLYCOLAX Take 17 g by mouth daily as needed for severe constipation.   Simethicone 125 MG Caps Take 125 mg by mouth every 6 (six) hours as needed.        DISCHARGE INSTRUCTIONS:   Follow-up PMD 5 days Follow-up rheumatology when able to get an appointment. Follow-up gastroenterology in a few weeks Follow-up general surgery 1 month  If you experience worsening of your admission symptoms, develop shortness of breath, life threatening emergency, suicidal or homicidal thoughts you must seek medical attention immediately by calling 911 or calling your MD immediately  if symptoms less severe.  You Must read complete instructions/literature along with all the possible adverse reactions/side effects for all the Medicines you take and that have been  prescribed to you. Take any new Medicines after you have completely understood and accept all the possible adverse reactions/side effects.   Please note  You were cared for by a hospitalist during your hospital stay. If you have any questions about your discharge medications or the care you received while you were in the hospital after you are discharged, you can call the unit and asked to speak with the hospitalist on call if the hospitalist that took care of you is not available. Once you are discharged, your primary care physician will handle any further medical issues. Please note that NO REFILLS for any discharge medications will be authorized once you are discharged, as it is imperative that you return to your primary care physician (or establish a relationship with a primary care physician if you do not have one) for your aftercare needs so that they can reassess your need for medications and monitor your lab values.    Today   CHIEF COMPLAINT:  No chief complaint on file.   HISTORY OF  PRESENT ILLNESS:  Tamara Higgins  is a 45 y.o. female came in with abdominal pain and found to have a pancreatitis   VITAL SIGNS:  Blood pressure 140/77, pulse 70, temperature 98 F (36.7 C), temperature source Oral, resp. rate 20, height _0  (1.575 m), weight (!) 158.3 kg, last menstrual period 04/14/2020, SpO2 98 %, unknown if currently breastfeeding.  I/O:    Intake/Output Summary (Last 24 hours) at 04/18/2020 1420 Last data filed at 04/18/2020 1014 Gross per 24 hour  Intake 1509.51 ml  Output 352 ml  Net 1157.51 ml    PHYSICAL EXAMINATION:  GENERAL:  45 y.o.-year-old patient lying in the bed with no acute distress.  EYES: Pupils equal, round, reactive to light and accommodation. No scleral icterus. HEENT: Head atraumatic, normocephalic. Oropharynx and nasopharynx clear.  NECK:  Supple, no jugular venous distention. No thyroid enlargement, no tenderness.  LUNGS: Normal breath sounds  bilaterally, no wheezing, rales,rhonchi or crepitation. No use of accessory muscles of respiration.  CARDIOVASCULAR: S1, S2 normal. No murmurs, rubs, or gallops.  ABDOMEN: Soft, non-tender, non-distended.  EXTREMITIES: Trace pedal edema.  NEUROLOGIC: Cranial nerves II through XII are intact. Muscle strength 5/5 in all extremities. Sensation intact. Gait not checked.  PSYCHIATRIC: The patient is alert and oriented x 3.  SKIN: No obvious rash, lesion, or ulcer.   DATA REVIEW:   CBC Recent Labs  Lab 04/16/20 0439  WBC 9.5  HGB 14.9  HCT 44.8  PLT 397    Chemistries  Recent Labs  Lab 04/17/20 0413 04/18/20 0521  NA 134* 135  K 3.6 3.7  CL 99 100  CO2 28 26  GLUCOSE 192* 192*  BUN <5* 7  CREATININE 1.04* 1.02*  CALCIUM 9.0 9.0  MG 1.6* 1.8  AST 18  --   ALT 19  --   ALKPHOS 59  --   BILITOT 0.8  --      Management plans discussed with the patient, and she is in agreement.  CODE STATUS:     Code Status Orders  (From admission, onward)         Start     Ordered   04/13/20 2133  Full code  Continuous        04/13/20 2137        Code Status History    Date Active Date Inactive Code Status Order ID Comments User Context   07/28/2018 0902 07/30/2018 2012 Full Code [id number]  Harrie Foreman, MD Inpatient   Advance Care Planning Activity      TOTAL TIME TAKING CARE OF THIS PATIENT: 35 minutes.    Loletha Grayer M.D on 04/18/2020 at 2:20 PM  Between 7am to 6pm - Pager - [phone]  After 6pm go to [email or web address] - password [email or web address]  [facility]  CC: Primary care physician; DR Sharyn Creamer

## 2020-04-18 NOTE — Plan of Care (Signed)
Discharge order received. Patient mental status is at baseline. Vital signs stable . No signs of acute distress. Discharge instructions given. Patient verbalized understanding. No other issues noted at this time.   

## 2020-07-11 IMAGING — CT CT ABDOMEN AND PELVIS WITH CONTRAST
2 of 5 series · 17 of 46 positions shown, 19 images · IV contrast (omnipaque)
Comparison: None.

CLINICAL DATA: Severe epigastric pain.  Concern for pancreatitis

EXAM:
CT ABDOMEN AND PELVIS WITH CONTRAST
TECHNIQUE: Multidetector CT imaging of the abdomen and pelvis was performed
using the standard protocol following bolus administration of
intravenous contrast.
CONTRAST:  150mL OMNIPAQUE IOHEXOL 300 MG/ML  SOLN

[Series 2: routine abd/pel with · axial · 0.97mm/px · z∈[-234,+141]mm · 14 of 85 slices shown, 16 images]
[im 5/85  soft-tissue]
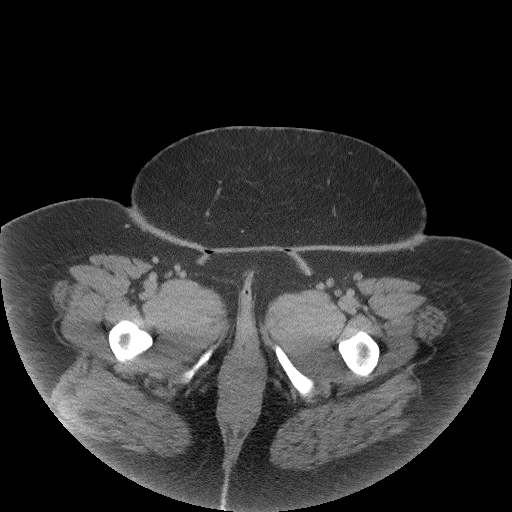
[im 5/85  bone]
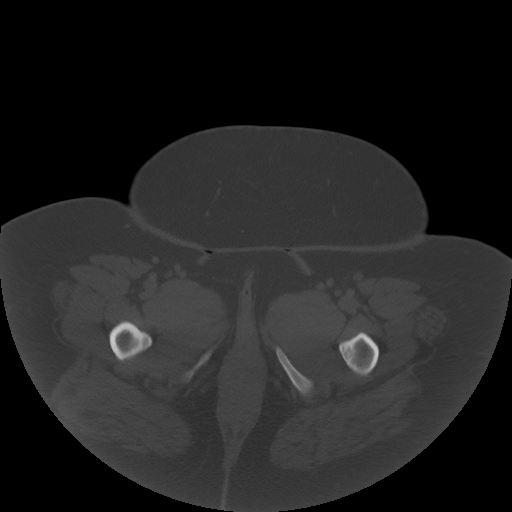
[im 10/85  soft-tissue]
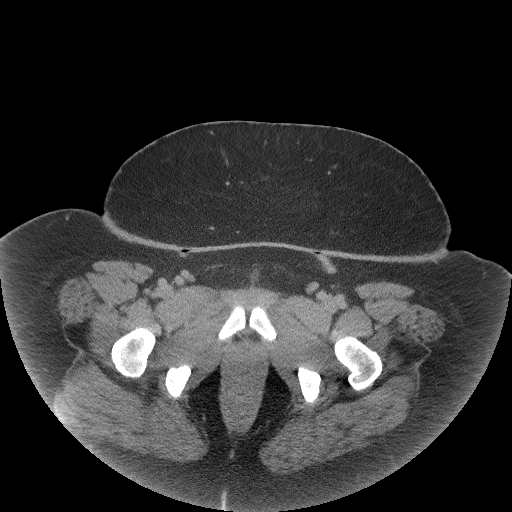
[im 15/85  soft-tissue]
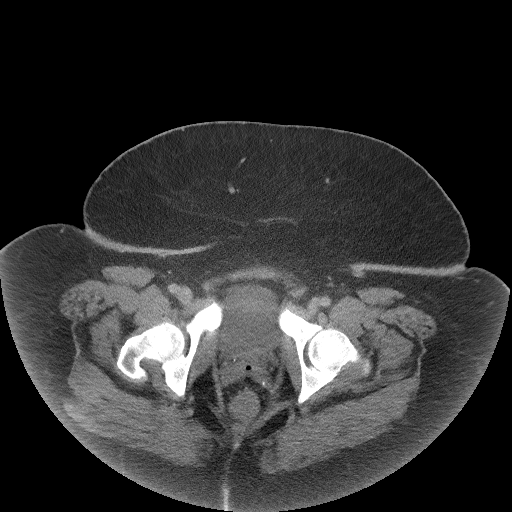
[im 25/85  soft-tissue]
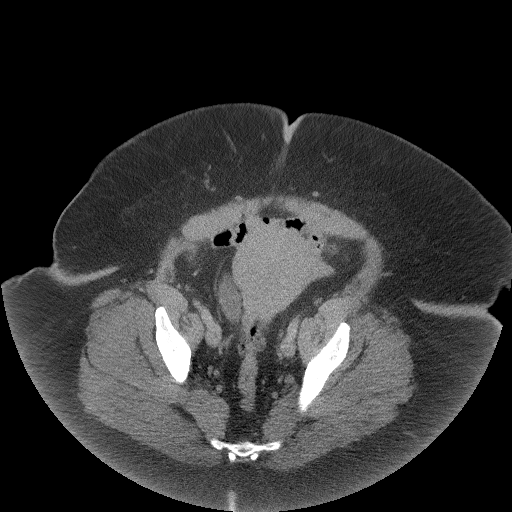
[im 30/85  soft-tissue]
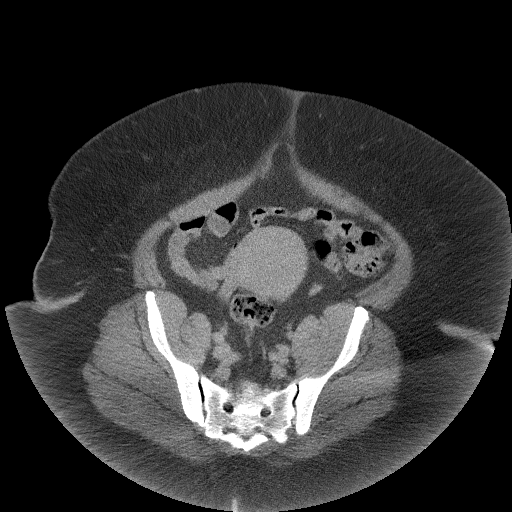
[im 35/85  soft-tissue]
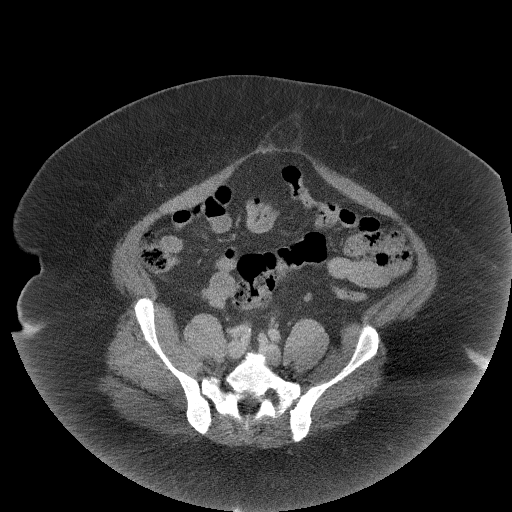
[im 40/85  soft-tissue]
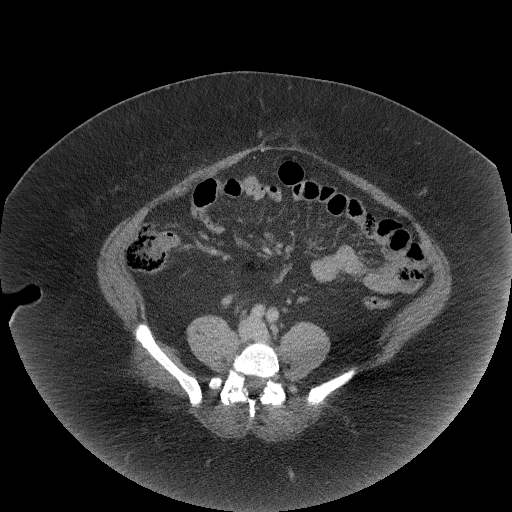
[im 45/85  soft-tissue]
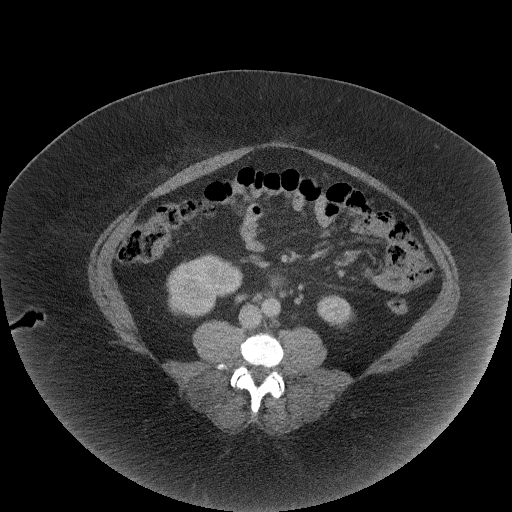
[im 50/85  soft-tissue]
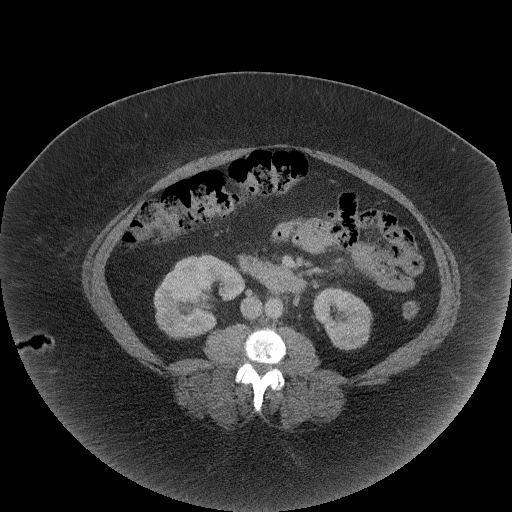
[im 50/85  bone]
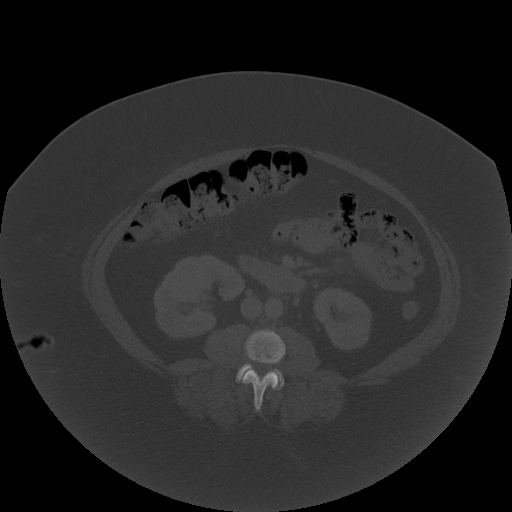
[im 55/85  soft-tissue]
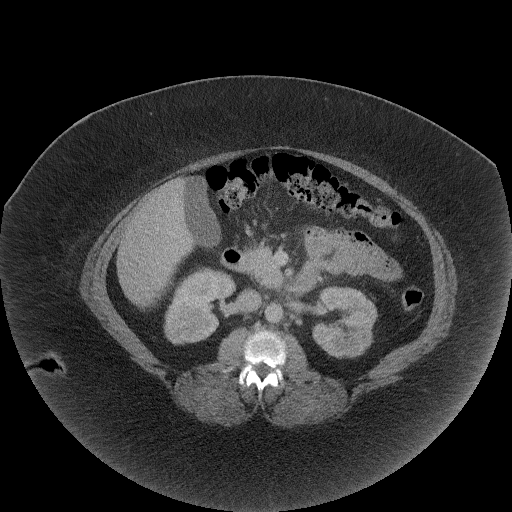
[im 65/85  soft-tissue]
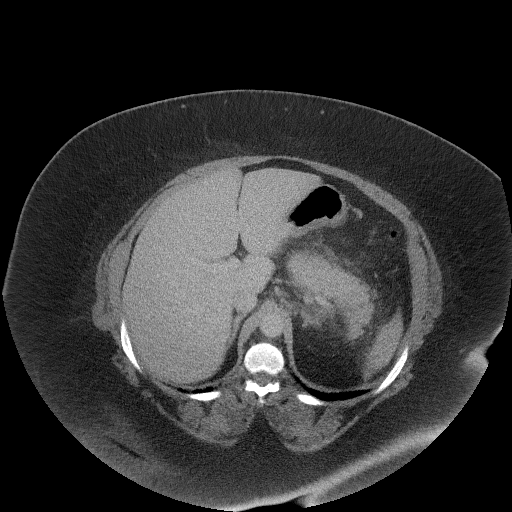
[im 70/85  soft-tissue]
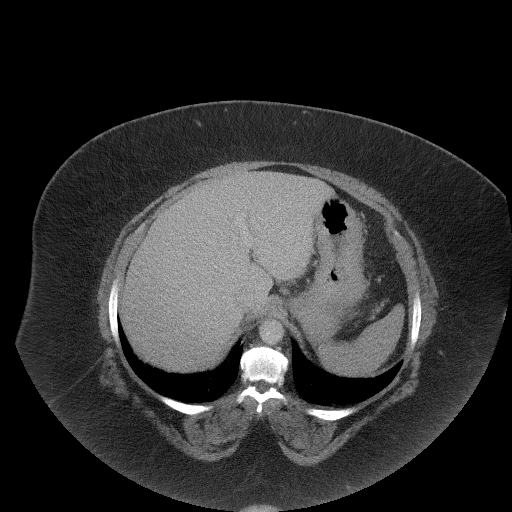
[im 75/85  soft-tissue]
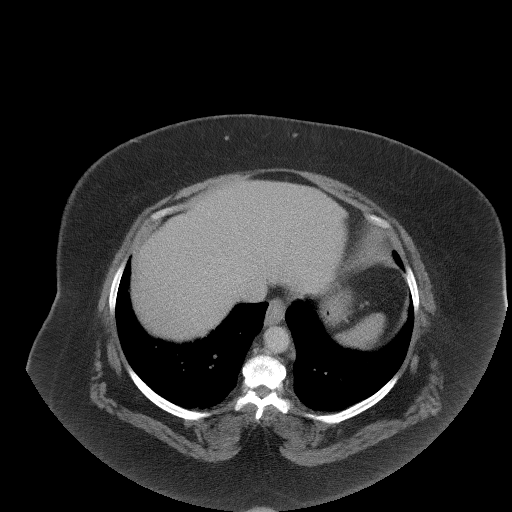
[im 80/85  soft-tissue]
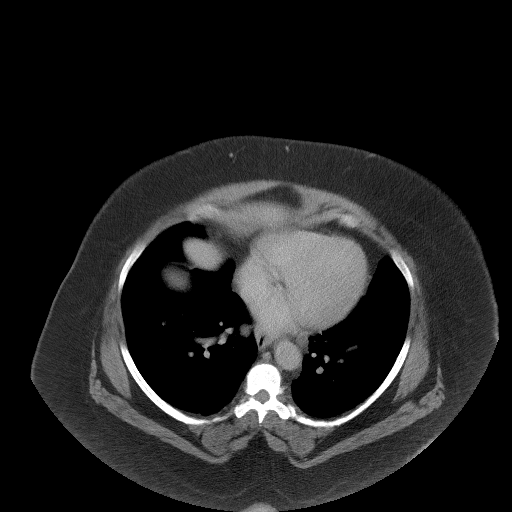

[Series 5: coronal st · coronal · 0.90mm/px · 3 of 144 slices shown]
[im 48/144  soft-tissue]
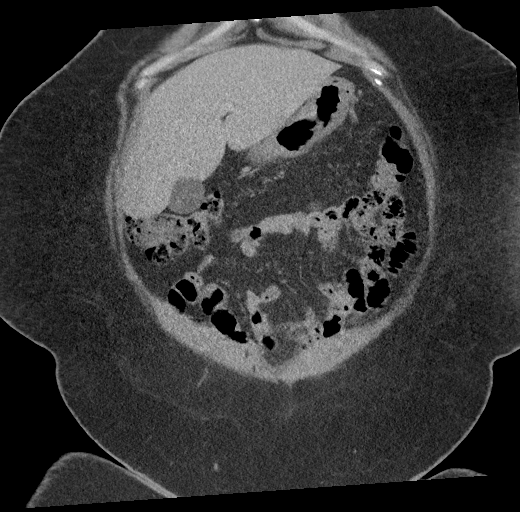
[im 64/144  soft-tissue]
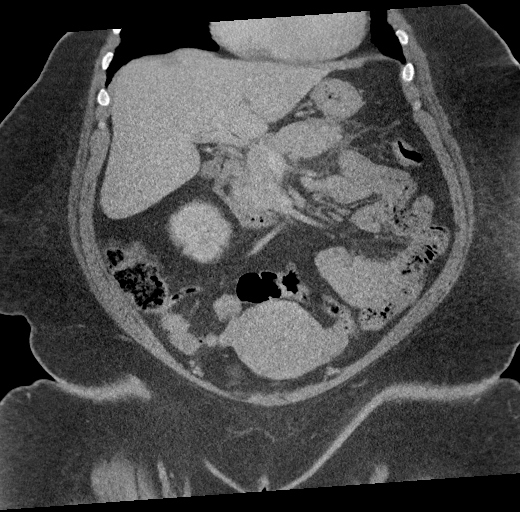
[im 80/144  soft-tissue]
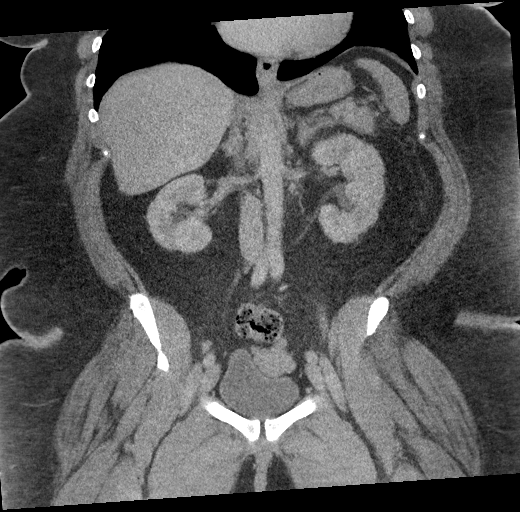

[17 of 46 positions shown; findings below may reference images not displayed]

FINDINGS: Lower chest: Lung bases are clear. No effusions. Heart is normal
size.

Hepatobiliary: No focal hepatic abnormality. Gallbladder
unremarkable.

Pancreas: Inflammation noted around the pancreatic body and tail
compatible with acute pancreatitis. Normal enhancement. No ductal
dilatation.

Spleen: No focal abnormality.  Normal size.

Adrenals/Urinary Tract: Lobular contours of the kidneys bilaterally.
No focal renal or adrenal mass. No stones or hydronephrosis. Urinary
bladder unremarkable.

Stomach/Bowel: Stomach, large and small bowel grossly unremarkable.
Normal appendix.

Vascular/Lymphatic: No evidence of aneurysm or adenopathy.

Reproductive: Uterus and adnexa unremarkable.  No mass.

Other: No free fluid or free air.

Musculoskeletal: No acute bony abnormality.
IMPRESSION: Stranding/inflammation around the pancreatic body and tail
compatible with acute pancreatitis. No complicating feature.

## 2020-11-19 ENCOUNTER — Other Ambulatory Visit: Payer: Self-pay

## 2020-11-19 ENCOUNTER — Ambulatory Visit
Admission: EM | Admit: 2020-11-19 | Discharge: 2020-11-19 | Disposition: A | Discharge status: Home / Self Care | Payer: BC Managed Care – PPO | Attending: Emergency Medicine | Admitting: Emergency Medicine

## 2020-11-19 ENCOUNTER — Encounter: Payer: Self-pay | Admitting: Emergency Medicine

## 2020-11-19 ENCOUNTER — Ambulatory Visit (INDEPENDENT_AMBULATORY_CARE_PROVIDER_SITE_OTHER): Payer: BC Managed Care – PPO

## 2020-11-19 DIAGNOSIS — R1084 Generalized abdominal pain: Secondary | ICD-10-CM | POA: Diagnosis present

## 2020-11-19 DIAGNOSIS — R109 Unspecified abdominal pain: Secondary | ICD-10-CM

## 2020-11-19 LAB — CBC WITH DIFFERENTIAL/PLATELET
Abs Immature Granulocytes: 0.02 10*3/uL (ref 0.00–0.07)
Basophils Absolute: 0.1 10*3/uL (ref 0.0–0.1)
Basophils Relative: 1 %
Eosinophils Absolute: 0.2 10*3/uL (ref 0.0–0.5)
Eosinophils Relative: 2 %
HCT: 47.1 % — ABNORMAL HIGH (ref 36.0–46.0)
Hemoglobin: 15.8 g/dL — ABNORMAL HIGH (ref 12.0–15.0)
Immature Granulocytes: 0 %
Lymphocytes Relative: 28 %
Lymphs Abs: 3.2 10*3/uL (ref 0.7–4.0)
MCH: 28.8 pg (ref 26.0–34.0)
MCHC: 33.5 g/dL (ref 30.0–36.0)
MCV: 85.8 fL (ref 80.0–100.0)
Monocytes Absolute: 0.8 10*3/uL (ref 0.1–1.0)
Monocytes Relative: 7 %
Neutro Abs: 7.4 10*3/uL (ref 1.7–7.7)
Neutrophils Relative %: 62 %
Platelets: 390 10*3/uL (ref 150–400)
RBC: 5.49 MIL/uL — ABNORMAL HIGH (ref 3.87–5.11)
RDW: 14.5 % (ref 11.5–15.5)
WBC: 11.8 10*3/uL — ABNORMAL HIGH (ref 4.0–10.5)
nRBC: 0 % (ref 0.0–0.2)

## 2020-11-19 LAB — COMPREHENSIVE METABOLIC PANEL
ALT: 11 U/L (ref 0–44)
AST: 15 U/L (ref 15–41)
Albumin: 3.8 g/dL (ref 3.5–5.0)
Alkaline Phosphatase: 97 U/L (ref 38–126)
Anion gap: 7 (ref 5–15)
BUN: 11 mg/dL (ref 6–20)
CO2: 27 mmol/L (ref 22–32)
Calcium: 9 mg/dL (ref 8.9–10.3)
Chloride: 101 mmol/L (ref 98–111)
Creatinine, Ser: 0.97 mg/dL (ref 0.44–1.00)
GFR, Estimated: >60 mL/min (ref >60)
Glucose, Bld: 130 mg/dL — ABNORMAL HIGH (ref 70–99)
Potassium: 4.5 mmol/L (ref 3.5–5.1)
Sodium: 135 mmol/L (ref 135–145)
Total Bilirubin: 0.6 mg/dL (ref 0.3–1.2)
Total Protein: 8 g/dL (ref 6.5–8.1)

## 2020-11-19 LAB — LIPASE, BLOOD: Lipase: 50 U/L (ref 11–51)

## 2020-11-19 MED ORDER — OXYCODONE HCL 5 MG PO TABS: 5.0000 mg | ORAL_TABLET | Freq: Four times a day (QID) | ORAL | 0 refills | Status: DC | PRN

## 2020-11-19 NOTE — Discharge Instructions (Addendum)
Your evaluation today did not lead to any definitive causes of your abdominal pain.  There is no elevation of your liver enzymes or your lipase so it is most likely not your pancreatitis.  Continue to take your Creon as previously directed.  Follow a bland and clear liquid diet until your symptoms improve.  Use the Zofran you have on hand for nausea and use your oxycodone as needed for more severe pain.  If you have any increase in your abdominal pain, nausea or vomiting or you cannot keep down medications or fluids, blood in her stool or vomit, or fever you need to go to the ER for evaluation.

## 2020-11-19 NOTE — ED Provider Notes (Addendum)
[facility]    CSN: [id number] Arrival date & time: 11/19/20  0934      History   Chief Complaint Chief Complaint  Patient presents with   Abdominal Pain    HPI Tamara Higgins is a 45 y.o. female.   HPI  72 old female here for evaluation of abdominal pain.  Patient reports that she has been experiencing abdominal pain for the past week.  She states that this is similar to when she has a pancreatitis flare.  She states that she is taking Creon to prevent pancreatitis and she is also changed her diet.  She recently had a friend come into town and cook for the family that mauled a lot of greasy and fatty foods.  She is unsure if this is causing her symptoms or not.  She states that her pain does increase with eating.  Prior to onset of symptoms she has been experiencing some constipation which resolved with over-the-counter milk of magnesia.  She states that she is now having some diarrhea but it is from the milk magnesia she believes rather than an infectious source.  She has not had any fever, nausea or vomiting, or noticed any blood in her stool.  She states that she is able to eat eat and drink but not to the levels that she is used to.  Past Medical History:  Diagnosis Date   Arthritis    COPD (chronic obstructive pulmonary disease) (HCC)    Diabetes mellitus without complication (HCC)    Hypertension    Morbid obesity (HCC)     Patient Active Problem List   Diagnosis Date Noted   Morbid obesity with BMI of 60.0-69.9, adult (HCC) 11/19/2020   Positive ANA (antinuclear antibody)    Ds DNA antibody positive    Hypomagnesemia    Constipation    Pica    Dehydration    Hypokalemia    COPD (chronic obstructive pulmonary disease) (HCC) 04/13/2020   Hypertension 04/13/2020   Cervical high risk HPV (human papillomavirus) test positive 10/17/2019   Abnormal uterine bleeding (AUB) 10/17/2019   Primary osteoarthritis of knee 03/12/2019   Pancreatitis 07/28/2018    Diabetes mellitus with complication (HCC) 11/19/2017   Arthritis 09/14/2017   Kidney stone on left side 03/10/2016   Tobacco use disorder 02/28/2013   Mild intermittent asthma without complication 01/18/2011   Morbid obesity (HCC) 05/24/2010   Obstructive sleep apnea 05/24/2010    Past Surgical History:  Procedure Laterality Date   CESAREAN SECTION      OB History     Gravida  1   Para      Term      Preterm      AB      Living         SAB      IAB      Ectopic      Multiple      Live Births               Home Medications    Prior to Admission medications   Medication Sig Start Date End Date Taking? Authorizing Provider  amLODipine (NORVASC) 10 MG tablet Take 1 tablet (10 mg total) by mouth daily. 07/31/18  Yes Gouru, Deanna Artis, MD  atorvastatin (LIPITOR) 10 MG tablet Take 10 mg by mouth every morning. 10/11/20  Yes [provider]  CREON 36000-114000 units CPEP capsule Take 72,000 Units by mouth 3 (three) times daily. 08/02/20  Yes [provider]  metFORMIN (GLUCOPHAGE) 500 MG tablet Take 1 tablet (500 mg total) by mouth 2 (two) times daily with a meal. 04/18/20  Yes Wieting, Richard, MD  metoprolol tartrate (LOPRESSOR) 100 MG tablet Take 1 tablet (100 mg total) by mouth 2 (two) times daily. 04/18/20  Yes Alford Highland, MD  acetaminophen (TYLENOL) 650 MG CR tablet Take 650 mg by mouth every 8 (eight) hours as needed for pain.    [provider]  lactulose (CHRONULAC) 10 GM/15ML solution Take 10 g by mouth 2 (two) times daily. 04/06/20   [provider]  magnesium oxide (MAG-OX) 400 (241.3 Mg) MG tablet Take 1 tablet (400 mg total) by mouth 2 (two) times daily. 04/18/20   Alford Highland, MD  ondansetron (ZOFRAN) 4 MG tablet Take 1 tablet (4 mg total) by mouth every 8 (eight) hours as needed for nausea or vomiting. 04/18/20   Alford Highland, MD  oxyCODONE (OXY IR/ROXICODONE) 5 MG immediate release tablet Take 1 tablet (5 mg total)  by mouth every 6 (six) hours as needed. 11/19/20   Becky Augusta, NP  polyethylene glycol (MIRALAX / GLYCOLAX) 17 g packet Take 17 g by mouth daily as needed for severe constipation. 04/18/20   Alford Highland, MD  Simethicone 125 MG CAPS Take 125 mg by mouth every 6 (six) hours as needed.    [provider]    Family History Family History  Problem Relation Age of Onset   Diabetes Mellitus II Mother    Hypertension Mother    Hypertension Father     Social History Social History   Tobacco Use   Smoking status: Every Day    Types: Cigarettes   Smokeless tobacco: Never  Vaping Use   Vaping Use: Never used  Substance Use Topics   Alcohol use: No     Allergies   Ciprofloxacin   Review of Systems Review of Systems  Constitutional:  Negative for activity change, appetite change and fever.  Gastrointestinal:  Positive for abdominal pain and diarrhea. Negative for blood in stool, constipation, nausea and vomiting.  Skin:  Negative for rash.  Hematological: Negative.   Psychiatric/Behavioral: Negative.      Physical Exam Triage Vital Signs ED Triage Vitals  Enc Vitals Group     BP 11/19/20 0952 117/67     Pulse Rate 11/19/20 0952 67     Resp 11/19/20 0952 16     Temp 11/19/20 0952 98.2 F (36.8 C)     Temp Source 11/19/20 0952 Oral     SpO2 11/19/20 0952 96 %     Weight 11/19/20 0948 (!) 311 lb (141.1 kg)     Height 11/19/20 0948 5\' 2"  (1.575 m)     Head Circumference --      Peak Flow --      Pain Score 11/19/20 0947 8     Pain Loc --      Pain Edu? --      Excl. in GC? --    No data found.  Updated Vital Signs BP 117/67 (BP Location: Left Arm)   Pulse 67   Temp 98.2 F (36.8 C) (Oral)   Resp 16   Ht 5\' 2"  (1.575 m)   Wt (!) 311 lb (141.1 kg)   LMP 10/27/2020 (Approximate) Comment: denies preg, signed waiver  SpO2 96%   BMI 56.88 kg/m   Visual Acuity Right Eye Distance:   Left Eye Distance:   Bilateral Distance:    Right Eye Near:  Left  Eye Near:    Bilateral Near:     Physical Exam Vitals and nursing note reviewed.  Constitutional:      General: She is in acute distress.     Appearance: She is obese.  HENT:     Head: Normocephalic and atraumatic.  Cardiovascular:     Rate and Rhythm: Normal rate and regular rhythm.     Pulses: Normal pulses.     Heart sounds: Normal heart sounds.  Pulmonary:     Effort: Pulmonary effort is normal.     Breath sounds: Normal breath sounds. No wheezing, rhonchi or rales.  Abdominal:     General: There is distension.     Tenderness: There is abdominal tenderness. There is no guarding or rebound.  Skin:    General: Skin is warm and dry.     Capillary Refill: Capillary refill takes less than 2 seconds.     Findings: No erythema or rash.  Neurological:     General: No focal deficit present.     Mental Status: She is oriented to person, place, and time.  Psychiatric:        Mood and Affect: Mood normal.        Behavior: Behavior normal.        Thought Content: Thought content normal.        Judgment: Judgment normal.     UC Treatments / Results  Labs (all labs ordered are listed, but only abnormal results are displayed) Labs Reviewed  CBC WITH DIFFERENTIAL/PLATELET - Abnormal; Notable for the following components:      Result Value   WBC 11.8 (*)    RBC 5.49 (*)    Hemoglobin 15.8 (*)    HCT 47.1 (*)    All other components within normal limits  COMPREHENSIVE METABOLIC PANEL - Abnormal; Notable for the following components:   Glucose, Bld 130 (*)    All other components within normal limits  LIPASE, BLOOD    EKG   Radiology DG Abdomen Acute W/Chest  Result Date: 11/19/2020 CLINICAL DATA:  Abdominal pain and distension. EXAM: DG ABDOMEN ACUTE WITH 1 VIEW CHEST COMPARISON:  None. FINDINGS: There is no evidence of dilated bowel loops or free intraperitoneal air. No radiopaque calculi or other significant radiographic abnormality is seen. Heart size and mediastinal  contours are within normal limits. Both lungs are clear. IUD overlies the pelvis. IMPRESSION: Negative abdominal radiographs.  No acute cardiopulmonary disease. Electronically Signed   By: Caprice Renshaw M.D.   On: 11/19/2020 11:36    Procedures Procedures (including critical care time)  Medications Ordered in UC Medications - No data to display  Initial Impression / Assessment and Plan / UC Course  I have reviewed the triage vital signs and the nursing notes.  Pertinent labs & imaging results that were available during my care of the patient were reviewed by me and considered in my medical decision making (see chart for details).  Patient is a pleasant, though ill-appearing, 45 year old female who is morbidly obese here for evaluation of abdominal pain has been going on for the past week.  She states that this feels like one of her pancreatitis flares.  Prior to the onset of pain she was experiencing constipation and she took some milk magnesia to resolve that.  She states that she passed a bunch of solid stool and is been having diarrhea ever since.  This not associate with fever, nausea, or vomiting.  She has not seen blood in her  stool.  She states that she has modified her diet and she mostly drinks juice, eats tuna, and salads.  She recently had a family friend, and since day with her.  During that time the family friend cooked and a lot of the food was fried.  Patient is currently taking Creon to prevent further pancreatitis episodes.  Patient's physical exam reveals a benign cardiopulmonary exam with clear lung sounds in all fields.  Patient's abdomen is protuberant and distended.  Bowel sounds are decreased in all quadrants.  Patient has generalized abdominal tenderness.  Patient does report that when she eats the pain does increase in her abdomen but it increases generally throughout the abdomen and not along particular place.  Will obtain three-way the abdomen as well as CBC, CMP, and  lipase.  CBC shows a mildly elevated white count of 11.8.  RBC count and H&H are also elevated.  May reflect serum concentration.  Platelets normal at 390 and there are no abnormalities on the differential.  CMP shows a mildly elevated glucose of 130 but is otherwise unremarkable.  Lipase 50.  Three-way the abdomen independently reviewed and evaluated by me.  Impression: The transverse colon is prominent with air.  No appearance of retained stool.  No dilated small bowel loops.  There is an IUD present in the anterior pelvis.  Radiology overread is pending. Radiology impression of three-way abdomen is negative for acute intra-abdominal process.  Will discharge patient home with diagnosis of generalized abdominal pain.  There is no evidence of pancreatitis reflected in her blood work.  The elevated white count I believe is secondary to hemoconcentration.  Patient has antiemetics at home per her medication list.  If patient symptoms worsen I will direct her to follow-up with the emergency department for further imaging.   Final Clinical Impressions(s) / UC Diagnoses   Final diagnoses:  Generalized abdominal pain     Discharge Instructions      Your evaluation today did not lead to any definitive causes of your abdominal pain.  There is no elevation of your liver enzymes or your lipase so it is most likely not your pancreatitis.  Continue to take your Creon as previously directed.  Follow a bland and clear liquid diet until your symptoms improve.  Use the Zofran you have on hand for nausea and use your oxycodone as needed for more severe pain.  If you have any increase in your abdominal pain, nausea or vomiting or you cannot keep down medications or fluids, blood in her stool or vomit, or fever you need to go to the ER for evaluation.     ED Prescriptions     Medication Sig Dispense Auth. Provider   oxyCODONE (OXY IR/ROXICODONE) 5 MG immediate release tablet Take 1 tablet (5 mg  total) by mouth every 6 (six) hours as needed. 10 tablet Becky Augusta, NP      I have reviewed the PDMP during this encounter.   Becky Augusta, NP 11/19/20 1151    Becky Augusta, NP 11/19/20 1152    Becky Augusta, NP 11/19/20 1153

## 2020-11-19 NOTE — ED Triage Notes (Signed)
Patient c/o lower abdominal pain that started a week ago.  Patient reports flares up of pancreatitis. Patient denies fevers.  Patient denies N/V/D.

## 2021-03-22 ENCOUNTER — Other Ambulatory Visit: Payer: Self-pay

## 2021-03-22 ENCOUNTER — Ambulatory Visit
Admission: EM | Admit: 2021-03-22 | Discharge: 2021-03-22 | Disposition: A | Discharge status: Home / Self Care | Payer: BC Managed Care – PPO | Attending: Internal Medicine | Admitting: Internal Medicine

## 2021-03-22 ENCOUNTER — Ambulatory Visit (INDEPENDENT_AMBULATORY_CARE_PROVIDER_SITE_OTHER): Payer: BC Managed Care – PPO

## 2021-03-22 DIAGNOSIS — J40 Bronchitis, not specified as acute or chronic: Secondary | ICD-10-CM | POA: Diagnosis not present

## 2021-03-22 DIAGNOSIS — R059 Cough, unspecified: Secondary | ICD-10-CM

## 2021-03-22 DIAGNOSIS — R0600 Dyspnea, unspecified: Secondary | ICD-10-CM | POA: Diagnosis not present

## 2021-03-22 LAB — RAPID INFLUENZA A&B ANTIGENS
Influenza A (ARMC): NEGATIVE
Influenza B (ARMC): NEGATIVE

## 2021-03-22 MED ORDER — ALBUTEROL SULFATE HFA 108 (90 BASE) MCG/ACT IN AERS: 2.0000 | INHALATION_SPRAY | RESPIRATORY_TRACT | 0 refills | Status: DC | PRN

## 2021-03-22 MED ORDER — DOXYCYCLINE HYCLATE 100 MG PO CAPS: 100.0000 mg | ORAL_CAPSULE | Freq: Two times a day (BID) | ORAL | 0 refills | Status: DC

## 2021-03-22 NOTE — ED Provider Notes (Signed)
[facility]    CSN: [id number] Arrival date & time: 03/22/21  1915      History   Chief Complaint Chief Complaint  Patient presents with   Cough   Shortness of Breath   Sore Throat    HPI Tamara Higgins is a 46 y.o. female who presents with cough x 2 weeks. She was seen by her PCP via virtual visit and placed on antibiotics which normally helps when she gets bronchitis, but she got worse yesterday with body aches and HA. Did a home covid test which is negative. She was given Zpack and Tessalon.  She admits she is still a smoker.    Past Medical History:  Diagnosis Date   Arthritis    COPD (chronic obstructive pulmonary disease) (HCC)    Diabetes mellitus without complication (HCC)    Hypertension    Morbid obesity (HCC)     Patient Active Problem List   Diagnosis Date Noted   Morbid obesity with BMI of 60.0-69.9, adult (HCC) 11/19/2020   Positive ANA (antinuclear antibody)    Ds DNA antibody positive    Hypomagnesemia    Constipation    Pica    Dehydration    Hypokalemia    COPD (chronic obstructive pulmonary disease) (HCC) 04/13/2020   Hypertension 04/13/2020   Cervical high risk HPV (human papillomavirus) test positive 10/17/2019   Abnormal uterine bleeding (AUB) 10/17/2019   Primary osteoarthritis of knee 03/12/2019   Pancreatitis 07/28/2018   Diabetes mellitus with complication (HCC) 11/19/2017   Arthritis 09/14/2017   Kidney stone on left side 03/10/2016   Tobacco use disorder 02/28/2013   Mild intermittent asthma without complication 01/18/2011   Morbid obesity (HCC) 05/24/2010   Obstructive sleep apnea 05/24/2010    Past Surgical History:  Procedure Laterality Date   CESAREAN SECTION      OB History     Gravida  1   Para      Term      Preterm      AB      Living         SAB      IAB      Ectopic      Multiple      Live Births               Home Medications    Prior to Admission medications   Medication  Sig Start Date End Date Taking? Authorizing Provider  acetaminophen (TYLENOL) 650 MG CR tablet Take 650 mg by mouth every 8 (eight) hours as needed for pain.   Yes [provider]  albuterol (VENTOLIN HFA) 108 (90 Base) MCG/ACT inhaler Inhale 2 puffs into the lungs every 4 (four) hours as needed for wheezing or shortness of breath. Prn wheezing, cough and SOB 03/22/21  Yes Rodriguez-Southworth, Nettie Elm, PA-C  amLODipine (NORVASC) 10 MG tablet Take 1 tablet (10 mg total) by mouth daily. 07/31/18  Yes Gouru, Deanna Artis, MD  atorvastatin (LIPITOR) 10 MG tablet Take 10 mg by mouth every morning. 10/11/20  Yes [provider]  CREON 36000-114000 units CPEP capsule Take 72,000 Units by mouth 3 (three) times daily. 08/02/20  Yes [provider]  doxycycline (VIBRAMYCIN) 100 MG capsule Take 1 capsule (100 mg total) by mouth 2 (two) times daily. 03/22/21  Yes Rodriguez-Southworth, Nettie Elm, PA-C  magnesium oxide (MAG-OX) 400 (241.3 Mg) MG tablet Take 1 tablet (400 mg total) by mouth 2 (two) times daily. 04/18/20  Yes Alford Highland, MD  metFORMIN (GLUCOPHAGE) 500 MG tablet Take 1 tablet (500 mg total) by mouth 2 (two) times daily with a meal. 04/18/20  Yes Wieting, Richard, MD  metoprolol tartrate (LOPRESSOR) 100 MG tablet Take 1 tablet (100 mg total) by mouth 2 (two) times daily. 04/18/20  Yes Wieting, Richard, MD  ondansetron (ZOFRAN) 4 MG tablet Take 1 tablet (4 mg total) by mouth every 8 (eight) hours as needed for nausea or vomiting. 04/18/20  Yes Wieting, Richard, MD  polyethylene glycol (MIRALAX / GLYCOLAX) 17 g packet Take 17 g by mouth daily as needed for severe constipation. 04/18/20  Yes Alford Highland, MD  Simethicone 125 MG CAPS Take 125 mg by mouth every 6 (six) hours as needed.   Yes [provider]    Family History Family History  Problem Relation Age of Onset   Diabetes Mellitus II Mother    Hypertension Mother    Hypertension Father     Social History Social History    Tobacco Use   Smoking status: Every Day    Types: Cigarettes   Smokeless tobacco: Never  Vaping Use   Vaping Use: Never used  Substance Use Topics   Alcohol use: No   Drug use: Never     Allergies   Ciprofloxacin   Review of Systems Review of Systems  Constitutional:  Positive for chills. Negative for fever.  HENT:  Positive for rhinorrhea and sore throat. Negative for congestion, ear discharge and ear pain.   Respiratory:  Positive for cough, shortness of breath and wheezing.   Musculoskeletal:  Positive for myalgias.  Skin:  Negative for rash.  Neurological:  Positive for headaches.  Hematological:  Negative for adenopathy.    Physical Exam Triage Vital Signs ED Triage Vitals  Enc Vitals Group     BP 03/22/21 1943 (!) 163/94     Pulse Rate 03/22/21 1943 (!) 104     Resp 03/22/21 1943 18     Temp 03/22/21 1943 98.9 F (37.2 C)     Temp Source 03/22/21 1943 Oral     SpO2 03/22/21 1943 97 %     Weight 03/22/21 1941 (!) 311 lb (141.1 kg)     Height 03/22/21 1941 5\' 2"  (1.575 m)     Head Circumference --      Peak Flow --      Pain Score 03/22/21 1941 5     Pain Loc --      Pain Edu? --      Excl. in GC? --    No data found.  Updated Vital Signs BP (!) 163/94 (BP Location: Left Arm)    Pulse (!) 104    Temp 98.9 F (37.2 C) (Oral)    Resp 18    Ht 5\' 2"  (1.575 m)    Wt (!) 311 lb (141.1 kg)    LMP 03/22/2021    SpO2 97%    BMI 56.88 kg/m   Visual Acuity Right Eye Distance:   Left Eye Distance:   Bilateral Distance:    Right Eye Near:   Left Eye Near:    Bilateral Near:      Physical Exam Constitutional:      General: He is not in acute distress.    Appearance: He is not toxic-appearing.  HENT: both of her eyes are tearing     Head: Normocephalic.     Right Ear: Tympanic membrane, ear canal and external ear normal.     Left Ear: Ear canal and external  ear normal.     Nose: Nose normal.     Mouth/Throat:     Mouth: Mucous membranes are moist.      Pharynx: Oropharynx is clear.  Eyes:     General: No scleral icterus.    Conjunctiva/sclera: Conjunctivae normal.  Cardiovascular:     Rate and Rhythm: Normal rate and regular rhythm.     Heart sounds: No murmur heard.   Pulmonary:     Effort: Pulmonary effort is normal. No respiratory distress.     Breath sounds: has mild Wheezing present.   Musculoskeletal:        General: Normal range of motion.     Cervical back: Neck supple.  Lymphadenopathy:     Cervical: No cervical adenopathy.  Skin:    General: Skin is warm and dry.     Findings: No rash.  Neurological:     Mental Status: He is alert and oriented to person, place, and time.     Gait: Gait normal.  Psychiatric:        Mood and Affect: Mood normal.        Behavior: Behavior normal.        Thought Content: Thought content normal.        Judgment: Judgment normal.    UC Treatments / Results  Labs (all labs ordered are listed, but only abnormal results are displayed) Labs Reviewed  RAPID INFLUENZA A&B ANTIGENS  FluA&B negative  EKG   Radiology DG Chest 2 View  Result Date: 03/22/2021 CLINICAL DATA:  Cough, dyspnea EXAM: CHEST - 2 VIEW COMPARISON:  None. FINDINGS: The heart size and mediastinal contours are within normal limits. Both lungs are clear. The visualized skeletal structures are unremarkable. IMPRESSION: No active cardiopulmonary disease. Electronically Signed   By: Helyn Numbers M.D.   On: 03/22/2021 20:18    Procedures Procedures (including critical care time)  Medications Ordered in UC Medications - No data to display  Initial Impression / Assessment and Plan / UC Course  I have reviewed the triage vital signs and the nursing notes. Pertinent labs & imaging results that were available during my care of the patient were reviewed by me and considered in my medical decision making (see chart for details). Bronchitis unresolved I placed her on Doxy and Albuterol inhaler as noted.   Final  Clinical Impressions(s) / UC Diagnoses   Final diagnoses:  Bronchitis   Discharge Instructions   None    ED Prescriptions     Medication Sig Dispense Auth. Provider   albuterol (VENTOLIN HFA) 108 (90 Base) MCG/ACT inhaler  (Status: Discontinued) Inhale 2 puffs into the lungs every 4 (four) hours as needed for wheezing or shortness of breath. 18 g Rodriguez-Southworth, Nettie Elm, PA-C   doxycycline (VIBRAMYCIN) 100 MG capsule Take 1 capsule (100 mg total) by mouth 2 (two) times daily. 20 capsule Rodriguez-Southworth, Nettie Elm, PA-C   albuterol (VENTOLIN HFA) 108 (90 Base) MCG/ACT inhaler Inhale 2 puffs into the lungs every 4 (four) hours as needed for wheezing or shortness of breath. Prn wheezing, cough and SOB 18 g Rodriguez-Southworth, Nettie Elm, PA-C      PDMP not reviewed this encounter.   Garey Ham, Cordelia Poche 03/22/21 2034

## 2021-03-22 NOTE — ED Triage Notes (Signed)
Pt c/o sore throat, SOB, cough, Chest Congestion. X 1day  Pt took a home covid test yesterday and it was negative.   Pt is worried about bronchitis.

## 2021-08-30 ENCOUNTER — Ambulatory Visit (INDEPENDENT_AMBULATORY_CARE_PROVIDER_SITE_OTHER): Payer: BC Managed Care – PPO

## 2021-08-30 ENCOUNTER — Ambulatory Visit
Admission: EM | Admit: 2021-08-30 | Discharge: 2021-08-30 | Disposition: A | Discharge status: Home / Self Care | Payer: BC Managed Care – PPO | Attending: Physician Assistant | Admitting: Physician Assistant

## 2021-08-30 DIAGNOSIS — K5900 Constipation, unspecified: Secondary | ICD-10-CM

## 2021-08-30 DIAGNOSIS — J449 Chronic obstructive pulmonary disease, unspecified: Secondary | ICD-10-CM | POA: Diagnosis not present

## 2021-08-30 DIAGNOSIS — E119 Type 2 diabetes mellitus without complications: Secondary | ICD-10-CM | POA: Diagnosis present

## 2021-08-30 DIAGNOSIS — I10 Essential (primary) hypertension: Secondary | ICD-10-CM | POA: Diagnosis not present

## 2021-08-30 DIAGNOSIS — K859 Acute pancreatitis without necrosis or infection, unspecified: Secondary | ICD-10-CM | POA: Diagnosis present

## 2021-08-30 DIAGNOSIS — E860 Dehydration: Secondary | ICD-10-CM

## 2021-08-30 DIAGNOSIS — R5383 Other fatigue: Secondary | ICD-10-CM | POA: Diagnosis not present

## 2021-08-30 DIAGNOSIS — R1084 Generalized abdominal pain: Secondary | ICD-10-CM

## 2021-08-30 DIAGNOSIS — R350 Frequency of micturition: Secondary | ICD-10-CM | POA: Insufficient documentation

## 2021-08-30 LAB — CBC WITH DIFFERENTIAL/PLATELET
Abs Immature Granulocytes: 0.03 10*3/uL (ref 0.00–0.07)
Basophils Absolute: 0.1 10*3/uL (ref 0.0–0.1)
Basophils Relative: 1 %
Eosinophils Absolute: 0.1 10*3/uL (ref 0.0–0.5)
Eosinophils Relative: 1 %
HCT: 46.7 % — ABNORMAL HIGH (ref 36.0–46.0)
Hemoglobin: 15.7 g/dL — ABNORMAL HIGH (ref 12.0–15.0)
Immature Granulocytes: 0 %
Lymphocytes Relative: 25 %
Lymphs Abs: 2.5 10*3/uL (ref 0.7–4.0)
MCH: 28.9 pg (ref 26.0–34.0)
MCHC: 33.6 g/dL (ref 30.0–36.0)
MCV: 85.8 fL (ref 80.0–100.0)
Monocytes Absolute: 0.8 10*3/uL (ref 0.1–1.0)
Monocytes Relative: 8 %
Neutro Abs: 6.4 10*3/uL (ref 1.7–7.7)
Neutrophils Relative %: 65 %
Platelets: 350 10*3/uL (ref 150–400)
RBC: 5.44 MIL/uL — ABNORMAL HIGH (ref 3.87–5.11)
RDW: 14.8 % (ref 11.5–15.5)
WBC: 10 10*3/uL (ref 4.0–10.5)
nRBC: 0 % (ref 0.0–0.2)

## 2021-08-30 LAB — COMPREHENSIVE METABOLIC PANEL
ALT: 23 U/L (ref 0–44)
AST: 25 U/L (ref 15–41)
Albumin: 3.8 g/dL (ref 3.5–5.0)
Alkaline Phosphatase: 91 U/L (ref 38–126)
Anion gap: 9 (ref 5–15)
BUN: 14 mg/dL (ref 6–20)
CO2: 26 mmol/L (ref 22–32)
Calcium: 8.8 mg/dL — ABNORMAL LOW (ref 8.9–10.3)
Chloride: 101 mmol/L (ref 98–111)
Creatinine, Ser: 1.38 mg/dL — ABNORMAL HIGH (ref 0.44–1.00)
GFR, Estimated: 48 mL/min — ABNORMAL LOW (ref >60)
Glucose, Bld: 240 mg/dL — ABNORMAL HIGH (ref 70–99)
Potassium: 3.6 mmol/L (ref 3.5–5.1)
Sodium: 136 mmol/L (ref 135–145)
Total Bilirubin: 0.4 mg/dL (ref 0.3–1.2)
Total Protein: 7.9 g/dL (ref 6.5–8.1)

## 2021-08-30 LAB — URINALYSIS, ROUTINE W REFLEX MICROSCOPIC
Bilirubin Urine: NEGATIVE
Glucose, UA: NEGATIVE mg/dL
Ketones, ur: NEGATIVE mg/dL
Leukocytes,Ua: NEGATIVE
Nitrite: NEGATIVE
Protein, ur: >300 mg/dL — AB
Specific Gravity, Urine: >1.03 — ABNORMAL HIGH (ref 1.005–1.030)
pH: 5.5 (ref 5.0–8.0)

## 2021-08-30 LAB — URINALYSIS, MICROSCOPIC (REFLEX)

## 2021-08-30 LAB — LIPASE, BLOOD: Lipase: 40 U/L (ref 11–51)

## 2021-08-30 MED ORDER — SODIUM CHLORIDE 0.9 % IV BOLUS: 1000.0000 mL | Freq: Once | INTRAVENOUS | Status: DC

## 2021-08-30 NOTE — ED Triage Notes (Signed)
Patient c/o abdominal pain. Patient states she last regular BM was about a week ago.   Patient denies any discharge. Patient reports she is on a fluid pill so she already uses the restroom a lot.   Patient reports she was recently in the ED.

## 2021-08-30 NOTE — ED Provider Notes (Signed)
[facility]    CSN: [id number] Arrival date & time: 08/30/21  0941      History   Chief Complaint Chief Complaint  Patient presents with   Abdominal Pain    HPI Tamara Higgins is a 46 y.o. female presenting for mostly generalized abdominal pain for the past week.  Patient was seen at [facility] emergency department on 08/25/2021 times and did have a CT scan which showed acute pancreatitis.  Her lipase level was in the 80s so she was sent home with oxycodone and advised to take her Creon and drink plenty of fluids.  She has had pancreatitis in the past, but it has been a couple of years ago.  Patient says the oxycodone has not really helped her pain and her pain is worsened from when she went to the ER 5 days ago.  No associated fever.  She has been constipated.  Her last BM was today after taking MiraLAX.  She reports that she has not had a BM in 1 week before her BM today.  No vomiting.  Patient's medical history is significant for morbid obesity, COPD, diabetes, hypertension and renal stones.  HPI  Past Medical History:  Diagnosis Date   Arthritis    COPD (chronic obstructive pulmonary disease) (HCC)    Diabetes mellitus without complication (HCC)    Hypertension    Morbid obesity (HCC)     Patient Active Problem List   Diagnosis Date Noted   Morbid obesity with BMI of 60.0-69.9, adult (HCC) 11/19/2020   Positive ANA (antinuclear antibody)    Ds DNA antibody positive    Hypomagnesemia    Constipation    Pica    Dehydration    Hypokalemia    COPD (chronic obstructive pulmonary disease) (HCC) 04/13/2020   Hypertension 04/13/2020   Cervical high risk HPV (human papillomavirus) test positive 10/17/2019   Abnormal uterine bleeding (AUB) 10/17/2019   Primary osteoarthritis of knee 03/12/2019   Pancreatitis 07/28/2018   Diabetes mellitus with complication (HCC) 11/19/2017   Arthritis 09/14/2017   Kidney stone on left side 03/10/2016   Tobacco use disorder 02/28/2013    Mild intermittent asthma without complication 01/18/2011   Morbid obesity (HCC) 05/24/2010   Obstructive sleep apnea 05/24/2010    Past Surgical History:  Procedure Laterality Date   CESAREAN SECTION      OB History     Gravida  1   Para      Term      Preterm      AB      Living         SAB      IAB      Ectopic      Multiple      Live Births               Home Medications    Prior to Admission medications   Medication Sig Start Date End Date Taking? Authorizing Provider  acetaminophen (TYLENOL) 650 MG CR tablet Take 650 mg by mouth every 8 (eight) hours as needed for pain.    [provider]  albuterol (VENTOLIN HFA) 108 (90 Base) MCG/ACT inhaler Inhale 2 puffs into the lungs every 4 (four) hours as needed for wheezing or shortness of breath. Prn wheezing, cough and SOB 03/22/21   Rodriguez-Southworth, Nettie Elm, PA-C  amLODipine (NORVASC) 10 MG tablet Take 1 tablet (10 mg total) by mouth daily. 07/31/18   Ramonita Lab, MD  atorvastatin (LIPITOR) 10 MG tablet  Take 10 mg by mouth every morning. 10/11/20   [provider]  CREON 36000-114000 units CPEP capsule Take 72,000 Units by mouth 3 (three) times daily. 08/02/20   [provider]  magnesium oxide (MAG-OX) 400 (241.3 Mg) MG tablet Take 1 tablet (400 mg total) by mouth 2 (two) times daily. 04/18/20   Alford Highland, MD  metFORMIN (GLUCOPHAGE) 500 MG tablet Take 1 tablet (500 mg total) by mouth 2 (two) times daily with a meal. 04/18/20   Alford Highland, MD  metoprolol tartrate (LOPRESSOR) 100 MG tablet Take 1 tablet (100 mg total) by mouth 2 (two) times daily. 04/18/20   Alford Highland, MD  ondansetron (ZOFRAN) 4 MG tablet Take 1 tablet (4 mg total) by mouth every 8 (eight) hours as needed for nausea or vomiting. 04/18/20   Alford Highland, MD  polyethylene glycol (MIRALAX / GLYCOLAX) 17 g packet Take 17 g by mouth daily as needed for severe constipation. 04/18/20   Alford Highland, MD   Simethicone 125 MG CAPS Take 125 mg by mouth every 6 (six) hours as needed.    [provider]    Family History Family History  Problem Relation Age of Onset   Diabetes Mellitus II Mother    Hypertension Mother    Hypertension Father     Social History Social History   Tobacco Use   Smoking status: Every Day    Types: Cigarettes   Smokeless tobacco: Never  Vaping Use   Vaping Use: Never used  Substance Use Topics   Alcohol use: No   Drug use: Never     Allergies   Ciprofloxacin   Review of Systems Review of Systems  Constitutional:  Positive for appetite change and fatigue. Negative for fever.  Respiratory:  Negative for shortness of breath.   Cardiovascular:  Negative for chest pain.  Gastrointestinal:  Positive for abdominal pain and constipation. Negative for diarrhea, nausea and vomiting.  Genitourinary:  Positive for frequency. Negative for difficulty urinating, dysuria and urgency.  Musculoskeletal:  Negative for arthralgias and myalgias.  Neurological:  Negative for dizziness, weakness and headaches.     Physical Exam Triage Vital Signs ED Triage Vitals  Enc Vitals Group     BP 08/30/21 1006 (S) (!) 181/93     Pulse Rate 08/30/21 1006 78     Resp --      Temp 08/30/21 1006 98.3 F (36.8 C)     Temp Source 08/30/21 1006 Oral     SpO2 08/30/21 1006 94 %     Weight --      Height 08/30/21 1003 5\' 2"  (1.575 m)     Head Circumference --      Peak Flow --      Pain Score 08/30/21 1002 10     Pain Loc --      Pain Edu? --      Excl. in GC? --    No data found.  Updated Vital Signs BP (!) 169/93 (BP Location: Right Arm)   Pulse 78   Temp 98.3 F (36.8 C) (Oral)   Ht 5\' 2"  (1.575 m)   LMP 08/23/2021 (Approximate) Comment: denies preg, has irreg periods, signed waiver  SpO2 94%   BMI 56.88 kg/m   Physical Exam Vitals and nursing note reviewed.  Constitutional:      General: She is not in acute distress.    Appearance: Normal  appearance. She is obese. She is ill-appearing. She is not toxic-appearing.  HENT:  Head: Normocephalic and atraumatic.     Nose: Nose normal.     Mouth/Throat:     Mouth: Mucous membranes are moist.     Pharynx: Oropharynx is clear.  Eyes:     General: No scleral icterus.       Right eye: No discharge.        Left eye: No discharge.     Conjunctiva/sclera: Conjunctivae normal.  Cardiovascular:     Rate and Rhythm: Normal rate and regular rhythm.     Heart sounds: Normal heart sounds.  Pulmonary:     Effort: Pulmonary effort is normal. No respiratory distress.     Breath sounds: Normal breath sounds.  Abdominal:     General: Abdomen is protuberant.     Palpations: Abdomen is soft.     Tenderness: There is abdominal tenderness in the right upper quadrant, right lower quadrant, epigastric area, periumbilical area and suprapubic area. There is no right CVA tenderness, left CVA tenderness or rebound.  Musculoskeletal:     Cervical back: Neck supple.  Skin:    General: Skin is dry.  Neurological:     General: No focal deficit present.     Mental Status: She is alert. Mental status is at baseline.     Motor: No weakness.     Gait: Gait normal.  Psychiatric:        Mood and Affect: Mood normal.        Behavior: Behavior normal.        Thought Content: Thought content normal.      UC Treatments / Results  Labs (all labs ordered are listed, but only abnormal results are displayed) Labs Reviewed  URINALYSIS, ROUTINE W REFLEX MICROSCOPIC - Abnormal; Notable for the following components:      Result Value   Specific Gravity, Urine >1.030 (*)    Hgb urine dipstick LARGE (*)    Protein, ur >300 (*)    All other components within normal limits  URINALYSIS, MICROSCOPIC (REFLEX) - Abnormal; Notable for the following components:   Bacteria, UA FEW (*)    All other components within normal limits  COMPREHENSIVE METABOLIC PANEL - Abnormal; Notable for the following components:    Glucose, Bld 240 (*)    Creatinine, Ser 1.38 (*)    Calcium 8.8 (*)    GFR, Estimated 48 (*)    All other components within normal limits  CBC WITH DIFFERENTIAL/PLATELET - Abnormal; Notable for the following components:   RBC 5.44 (*)    Hemoglobin 15.7 (*)    HCT 46.7 (*)    All other components within normal limits  LIPASE, BLOOD    EKG   Radiology DG Abdomen 1 View  Result Date: 08/30/2021 CLINICAL DATA:  constipation x 1 week EXAM: ABDOMEN - 1 VIEW COMPARISON:  None Available. FINDINGS: Nonobstructive bowel gas pattern. Mild-to-moderate colonic and rectal stool burden. No evidence of free air or portal venous gas. Visualized lung bases are clear. Lower lumbar degenerative change. Calcified phleboliths in the anatomic pelvis. No evidence of renal calculi. IMPRESSION: Nonobstructive bowel gas pattern. Mild-to-moderate colonic and rectal stool burden. Electronically Signed   By: Feliberto HartsFrederick S Jones M.D.   On: 08/30/2021 10:58    Procedures Procedures (including critical care time)  Medications Ordered in UC Medications  sodium chloride 0.9 % bolus 1,000 mL (has no administration in time range)    Initial Impression / Assessment and Plan / UC Course  I have reviewed the triage vital signs and the nursing  notes.  Pertinent labs & imaging results that were available during my care of the patient were reviewed by me and considered in my medical decision making (see chart for details).  46 year old female presenting for 1 week history of severe and worsening abdominal pain.  Patient diagnosed with acute pancreatitis 5 days ago at [facility] ED.  Sent home.  Patient reports she normally gets admitted when she has flareups of pancreatitis.  She has taken oxycodone but has not helped her pain.  Patient also reports constipation for 1 week but did have a BM today after taking MiraLAX.  Blood pressure elevated at 181/93.  She is afebrile.  She is ill-appearing.  She is shifting in her chair as if  she is very uncomfortable and in pain.  Lungs are clear to auscultation on exam and heart regular rate and rhythm.  Abdomen is soft, protuberant.  She does have tenderness palpation of the epigastric, periumbilical, suprapubic, right upper quadrant and right lower quadrant.  Most of her pain is in the periumbilical region.  Urinalysis performed today which shows greater than 1.030 specific gravity, large hemoglobin and protein.  KUB obtained to assess for possible bowel obstruction.  Shows nonobstructive bowel gas pattern and mild to moderate colonic and rectal stool burden.  Reviewed patient's CT scan from 5 days ago which showed stranding along the pancreas indicating pancreatitis.  Her lipase level was in the 80s and WBC count was about 10.5.  Her labs were essentially stable so she was sent home with 2 days of oxycodone.  Discussed with patient that we could redraw her labs today and if they are worse she would need to go back to the emergency department and likely be admitted for acute pancreatitis, fluids and adequate pain control.  Also discussed that she could just go to the emergency department now since she is feeling so much worse but she would like to have labs drawn here first.  CBC, CMP and lipase obtained.   CBC without elevated white count.  CMP shows elevated creatinine of 1.38 and elevated glucose of 240.  8 months ago patient's creatinine was 0.97.  Lipase 40.  Discussed all lab results with patient.  I gave patient the option to receive IV fluids here and I can give her a couple more days of oxycodone as she is still having pancreatitis flareup.  Advised patient her other option would be to go back to the emergency department for IV fluids, pain control and possibly admission.  Patient really should be admitted given that she has an acute kidney injury hypertensive urgency, hyperglycemia in the face of diabetes, COPD, morbid obesity and worsening abdominal pain related to pancreatitis  over 1 week.  Patient wanted to try receiving IV fluids in clinic.  Our nursing staff attempted to start IV but was unable to obtain.  Patient then reported that she just wanted to go to the emergency department to get something more for pain and possibly to be admitted.  Patient plans to arrive [facility] emergency department.  She is leaving at this time in stable condition.   Final Clinical Impressions(s) / UC Diagnoses   Final diagnoses:  Acute pancreatitis, unspecified complication status, unspecified pancreatitis type  Generalized abdominal pain  Type 2 diabetes mellitus without complication, unspecified whether long term insulin use (HCC)  Dehydration     Discharge Instructions      Please go to ER for pain management, IV fluids and further monitoring.  Hopefully you get admitted  and they can help you feel better sooner.  You have been advised to follow up immediately in the emergency department for concerning signs.symptoms. If you declined EMS transport, please have a family member take you directly to the ED at this time. Do not delay. Based on concerns about condition, if you do not follow up in th e ED, you may risk poor outcomes including worsening of condition, delayed treatment and potentially life threatening issues. If you have declined to go to the ED at this time, you should call your PCP immediately to set up a follow up appointment.  Go to ED for red flag symptoms, including; fevers you cannot reduce with Tylenol/Motrin, severe headaches, vision changes, numbness/weakness in part of the body, lethargy, confusion, intractable vomiting, severe dehydration, chest pain, breathing difficulty, severe persistent abdominal or pelvic pain, signs of severe infection (increased redness, swelling of an area), feeling faint or passing out, dizziness, etc. You should especially go to the ED for sudden acute worsening of condition if you do not elect to go at this time.      ED  Prescriptions   None    I have reviewed the PDMP during this encounter.   Shirlee Latch, PA-C 08/30/21 1239

## 2021-08-30 NOTE — Discharge Instructions (Signed)
Please go to ER for pain management, IV fluids and further monitoring.  Hopefully you get admitted and they can help you feel better sooner.  You have been advised to follow up immediately in the emergency department for concerning signs.symptoms. If you declined EMS transport, please have a family member take you directly to the ED at this time. Do not delay. Based on concerns about condition, if you do not follow up in th e ED, you may risk poor outcomes including worsening of condition, delayed treatment and potentially life threatening issues. If you have declined to go to the ED at this time, you should call your PCP immediately to set up a follow up appointment.  Go to ED for red flag symptoms, including; fevers you cannot reduce with Tylenol/Motrin, severe headaches, vision changes, numbness/weakness in part of the body, lethargy, confusion, intractable vomiting, severe dehydration, chest pain, breathing difficulty, severe persistent abdominal or pelvic pain, signs of severe infection (increased redness, swelling of an area), feeling faint or passing out, dizziness, etc. You should especially go to the ED for sudden acute worsening of condition if you do not elect to go at this time.

## 2021-08-30 NOTE — ED Notes (Signed)
BP recheck @ 11:11 169/93

## 2021-08-30 NOTE — ED Notes (Signed)
Patient is being discharged from the Urgent Care and sent to the Emergency Department via Personal Vehicle. Per Nehemiah Settle, [place], patient is in need of higher level of care due to Acute Pancreatitis. Patient is aware and verbalizes understanding of plan of care.  Vitals:   08/30/21 1006 08/30/21 1111  BP: (S) (!) 181/93 (!) 169/93  Pulse: 78   Temp: 98.3 F (36.8 C)   SpO2: 94%

## 2021-12-05 DIAGNOSIS — Z833 Family history of diabetes mellitus: Secondary | ICD-10-CM | POA: Diagnosis not present

## 2021-12-05 DIAGNOSIS — Z6841 Body Mass Index (BMI) 40.0 and over, adult: Secondary | ICD-10-CM | POA: Diagnosis not present

## 2021-12-05 DIAGNOSIS — I10 Essential (primary) hypertension: Secondary | ICD-10-CM | POA: Diagnosis not present

## 2021-12-05 DIAGNOSIS — F17200 Nicotine dependence, unspecified, uncomplicated: Secondary | ICD-10-CM | POA: Diagnosis not present

## 2021-12-05 DIAGNOSIS — Z8249 Family history of ischemic heart disease and other diseases of the circulatory system: Secondary | ICD-10-CM | POA: Diagnosis not present

## 2021-12-05 DIAGNOSIS — Z7984 Long term (current) use of oral hypoglycemic drugs: Secondary | ICD-10-CM | POA: Diagnosis not present

## 2021-12-05 DIAGNOSIS — E119 Type 2 diabetes mellitus without complications: Secondary | ICD-10-CM | POA: Diagnosis not present

## 2021-12-05 DIAGNOSIS — K8681 Exocrine pancreatic insufficiency: Secondary | ICD-10-CM | POA: Diagnosis not present

## 2021-12-05 DIAGNOSIS — M1990 Unspecified osteoarthritis, unspecified site: Secondary | ICD-10-CM | POA: Diagnosis not present

## 2021-12-05 DIAGNOSIS — K219 Gastro-esophageal reflux disease without esophagitis: Secondary | ICD-10-CM | POA: Diagnosis not present

## 2022-02-14 ENCOUNTER — Encounter: Payer: Self-pay | Admitting: Emergency Medicine

## 2022-02-14 ENCOUNTER — Ambulatory Visit (INDEPENDENT_AMBULATORY_CARE_PROVIDER_SITE_OTHER): Payer: BC Managed Care – PPO

## 2022-02-14 ENCOUNTER — Ambulatory Visit
Admission: EM | Admit: 2022-02-14 | Discharge: 2022-02-14 | Disposition: A | Discharge status: Home / Self Care | Payer: BC Managed Care – PPO | Attending: Family Medicine | Admitting: Family Medicine

## 2022-02-14 DIAGNOSIS — J189 Pneumonia, unspecified organism: Secondary | ICD-10-CM | POA: Diagnosis not present

## 2022-02-14 DIAGNOSIS — J40 Bronchitis, not specified as acute or chronic: Secondary | ICD-10-CM | POA: Diagnosis not present

## 2022-02-14 DIAGNOSIS — J441 Chronic obstructive pulmonary disease with (acute) exacerbation: Secondary | ICD-10-CM

## 2022-02-14 MED ORDER — AZITHROMYCIN 250 MG PO TABS: 250.0000 mg | ORAL_TABLET | Freq: Every day | ORAL | 0 refills | Status: DC

## 2022-02-14 MED ORDER — CEFDINIR 300 MG PO CAPS: 300.0000 mg | ORAL_CAPSULE | Freq: Two times a day (BID) | ORAL | 0 refills | Status: AC

## 2022-02-14 MED ORDER — PREDNISONE 50 MG PO TABS: 50.0000 mg | ORAL_TABLET | Freq: Every day | ORAL | 0 refills | Status: AC

## 2022-02-14 NOTE — ED Triage Notes (Signed)
Pt c/o cough, lower back pain, and chills. She has been coughing for about 2 weeks. Denies fever.

## 2022-02-14 NOTE — Discharge Instructions (Addendum)
Your chest xray was concerning for possible pneumonia.  Stop by the pharmacy to pick up your prescriptions.  Follow up with your primary care provider as needed.  Remember to get a repeat chest xray in about a month.

## 2022-02-14 NOTE — ED Provider Notes (Signed)
[facility]    CSN: [id number] Arrival date & time: 02/14/22  1351      History   Chief Complaint Chief Complaint  Patient presents with   Cough    HPI Tamara Higgins is a 48 y.o. female.   HPI   Tamara Higgins presents for cough for the past 2 weeks.  Her kids and grandkids have been sick (RSV and COVID).  She gets tested for work. Has a cough that she can't shake. She feels like she can't breathe from time to time.  Has lower back pain in the right. She is coughing but the stuff won't come out.  No medications used for cough.     Fever : no  Chills: yes Sore throat: no Cough: yes Sputum: yes Nasal congestion : yes Rhinorrhea: no Myalgias: no Appetite: normal  Hydration: normal  Abdominal pain: no Nausea: no Vomiting: no Diarrhea: No Rash: No Sleep disturbance: yes  Headache: yes     Past Medical History:  Diagnosis Date   Arthritis    COPD (chronic obstructive pulmonary disease) ([place])    Diabetes mellitus without complication ([place])    Hypertension    Morbid obesity ([place])     Patient Active Problem List   Diagnosis Date Noted   Morbid obesity with BMI of 60.0-69.9, adult ([place]) 11/19/2020   Positive ANA (antinuclear antibody)    Ds DNA antibody positive    Hypomagnesemia    Constipation    Pica    Dehydration    Hypokalemia    COPD (chronic obstructive pulmonary disease) ([place]) 04/13/2020   Hypertension 04/13/2020   Cervical high risk HPV (human papillomavirus) test positive 10/17/2019   Abnormal uterine bleeding (AUB) 10/17/2019   Primary osteoarthritis of knee 03/12/2019   Pancreatitis 07/28/2018   Diabetes mellitus with complication ([place]) 34/19/3790   Arthritis 09/14/2017   Kidney stone on left side 03/10/2016   Tobacco use disorder 02/28/2013   Mild intermittent asthma without complication 24/10/7351   Morbid obesity ([place]) 05/24/2010   Obstructive sleep apnea 05/24/2010    Past Surgical History:  Procedure Laterality Date    CESAREAN SECTION      OB History     Gravida  1   Para      Term      Preterm      AB      Living         SAB      IAB      Ectopic      Multiple      Live Births               Home Medications    Prior to Admission medications   Medication Sig Start Date End Date Taking? Authorizing Provider  albuterol (VENTOLIN HFA) 108 (90 Base) MCG/ACT inhaler Inhale 2 puffs into the lungs every 4 (four) hours as needed for wheezing or shortness of breath. Prn wheezing, cough and SOB 03/22/21  Yes Rodriguez-Southworth, Sunday Spillers, PA-C  amLODipine (NORVASC) 10 MG tablet Take 1 tablet (10 mg total) by mouth daily. 07/31/18  Yes Gouru, Illene Silver, MD  atorvastatin (LIPITOR) 10 MG tablet Take 10 mg by mouth every morning. 10/11/20  Yes [provider]  azithromycin (ZITHROMAX Z-PAK) 250 MG tablet Take 1 tablet (250 mg total) by mouth daily. Take 2 tablets on day one 02/14/22  Yes Khylin Gutridge, DO  cefdinir (OMNICEF) 300 MG capsule Take 1 capsule (300 mg total) by mouth 2 (two) times daily for  5 days. 02/14/22 02/19/22 Yes Chandler Stofer, DO  CREON 36000-114000 units CPEP capsule Take 72,000 Units by mouth 3 (three) times daily. 08/02/20  Yes [provider]  metFORMIN (GLUCOPHAGE) 500 MG tablet Take 1 tablet (500 mg total) by mouth 2 (two) times daily with a meal. 04/18/20  Yes Wieting, Richard, MD  metoprolol tartrate (LOPRESSOR) 100 MG tablet Take 1 tablet (100 mg total) by mouth 2 (two) times daily. 04/18/20  Yes Wieting, Richard, MD  polyethylene glycol (MIRALAX / GLYCOLAX) 17 g packet Take 17 g by mouth daily as needed for severe constipation. 04/18/20  Yes Wieting, Richard, MD  predniSONE (DELTASONE) 50 MG tablet Take 1 tablet (50 mg total) by mouth daily for 5 days. 02/14/22 02/19/22 Yes Tyris Eliot, DO  Simethicone 125 MG CAPS Take 125 mg by mouth every 6 (six) hours as needed.   Yes [provider]  acetaminophen (TYLENOL) 650 MG CR tablet Take 650 mg by mouth every 8  (eight) hours as needed for pain.    [provider]  magnesium oxide (MAG-OX) 400 (241.3 Mg) MG tablet Take 1 tablet (400 mg total) by mouth 2 (two) times daily. 04/18/20   Loletha Grayer, MD  ondansetron (ZOFRAN) 4 MG tablet Take 1 tablet (4 mg total) by mouth every 8 (eight) hours as needed for nausea or vomiting. 04/18/20   Loletha Grayer, MD    Family History Family History  Problem Relation Age of Onset   Diabetes Mellitus II Mother    Hypertension Mother    Hypertension Father     Social History Social History   Tobacco Use   Smoking status: Every Day    Types: Cigarettes   Smokeless tobacco: Never  Vaping Use   Vaping Use: Never used  Substance Use Topics   Alcohol use: No   Drug use: Never     Allergies   Ciprofloxacin   Review of Systems Review of Systems: negative unless otherwise stated in HPI.      Physical Exam Triage Vital Signs ED Triage Vitals  Enc Vitals Group     BP 02/14/22 1532 (!) 155/94     Pulse Rate 02/14/22 1532 72     Resp 02/14/22 1532 16     Temp 02/14/22 1532 98.6 F (37 C)     Temp Source 02/14/22 1532 Oral     SpO2 02/14/22 1532 97 %     Weight 02/14/22 1529 (!) 311 lb 1.1 oz (141.1 kg)     Height 02/14/22 1529 5\' 2"  (1.575 m)     Head Circumference --      Peak Flow --      Pain Score 02/14/22 1529 8     Pain Loc --      Pain Edu? --      Excl. in [place]? --    No data found.  Updated Vital Signs BP (!) 155/94 (BP Location: Left Arm)   Pulse 72   Temp 98.6 F (37 C) (Oral)   Resp 16   Ht 5\' 2"  (1.575 m)   Wt (!) 141.1 kg   LMP 02/12/2022 (Approximate)   SpO2 97%   Breastfeeding No   BMI 56.90 kg/m   Visual Acuity Right Eye Distance:   Left Eye Distance:   Bilateral Distance:    Right Eye Near:   Left Eye Near:    Bilateral Near:     Physical Exam GEN:     alert, non-toxic appearing obese female in no distress  HENT:  mucus membranes moist, oropharyngeal without erythema, lesions or exudate,  clear nasal discharge EYES:   pupils equal and reactive, no scleral injection or discharge NECK:  normal ROM RESP:  no increased work of breathing, rhonchi, no wheezing, no rales CVS:   regular rate and rhythm Skin:   warm and dry    UC Treatments / Results  Labs (all labs ordered are listed, but only abnormal results are displayed) Labs Reviewed - No data to display  EKG   Radiology DG Chest 2 View  Result Date: 02/14/2022 CLINICAL DATA:  Cough for 2 weeks. EXAM: CHEST - 2 VIEW COMPARISON:  03/22/2021 FINDINGS: Patchy lingular opacities suspicious for pneumonia. The heart is normal in size. Mediastinal contours are normal. No pleural fluid. Mild bronchial thickening which appears chronic. No pneumothorax. On limited assessment, no acute osseous findings. IMPRESSION: Patchy lingular opacities suspicious for pneumonia. Followup PA and lateral chest X-ray is recommended in 3-4 weeks following trial of antibiotic therapy to ensure resolution and exclude underlying malignancy. Electronically Signed   By: Keith Rake M.D.   On: 02/14/2022 15:57    Procedures Procedures (including critical care time)  Medications Ordered in UC Medications - No data to display  Initial Impression / Assessment and Plan / UC Course  I have reviewed the triage vital signs and the nursing notes.  Pertinent labs & imaging results that were available during my care of the patient were reviewed by me and considered in my medical decision making (see chart for details).       Pt is a 47 y.o. female with history of COPD, smoking, OSA who presents for 2 weeks of cough that is not improving.  Gaby is  afebrile here without recent antipyretics. She is hypertensive. Satting well on room air. Overall, pt is  non-toxic appearing, well hydrated, without respiratory distress. Pulmonary exam is remarkable for rhonchi that clear with cough.  After shared decision making, we will pursue chest x-ray.  COVID  and  influenza testing deferred due to length of symptoms.  Radiologist notes patchy lingular opacities concerning for pneumonia.  Treat community-acquired pneumonia and likely COPD exacerbation with steroids and antibiotics as below. Typical duration of symptoms discussed. Return and ED precautions given and patient voiced understanding.   Discussed MDM, treatment plan and plan for follow-up with patient who agrees with plan.      Final Clinical Impressions(s) / UC Diagnoses   Final diagnoses:  COPD exacerbation ([place])  Community acquired pneumonia of left lung, unspecified part of lung     Discharge Instructions      Your chest xray was concerning for possible pneumonia.  Stop by the pharmacy to pick up your prescriptions.  Follow up with your primary care provider as needed.  Remember to get a repeat chest xray in about a month.        ED Prescriptions     Medication Sig Dispense Auth. Provider   cefdinir (OMNICEF) 300 MG capsule Take 1 capsule (300 mg total) by mouth 2 (two) times daily for 5 days. 10 capsule Dmitri Pettigrew, DO   azithromycin (ZITHROMAX Z-PAK) 250 MG tablet Take 1 tablet (250 mg total) by mouth daily. Take 2 tablets on day one 6 tablet Niyati Heinke, DO   predniSONE (DELTASONE) 50 MG tablet Take 1 tablet (50 mg total) by mouth daily for 5 days. 5 tablet Lyndee Hensen, DO      PDMP not reviewed this encounter.   Lyndee Hensen, DO 02/14/22 1735

## 2022-03-01 ENCOUNTER — Inpatient Hospital Stay: Payer: BC Managed Care – PPO | Attending: Obstetrics and Gynecology

## 2022-03-01 NOTE — Progress Notes (Deleted)
Gynecologic Oncology Consult Visit   Referring Provider: Dr. Leafy Ro   Chief Complaint: AUB & CIN 3  Subjective:  Tamara Higgins is a 47 y.o. female who is seen in consultation from Dr. Leafy Ro for abnormal uterine bleeding for several years despite mirena iud and CIN3.   She has had several years of nearly daily bleeding despite IUD. She has contraindications for combined hormonal control and prefers to minimize surgical procedures d/t risk. 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 She was also felt to be high risk of future endometrial abnormality given ongoing estrogen stimulation.   Patient has been managed by [facility] and recently evaluated by Dr. Leafy Ro.   AUB: - 09/2019: possible polyp, inactive endometrium, chronic endometritis - 11/12/19- TVUS- wnl, no right ovary seen. 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 Uterus sounded to 11 cm. Limited exam d/t body habitus and overlying bowel gas. No masses were seen. Endometrium was not thickened and w/o cystic change. Right ovary not visualized.  Lost to follow up  - 05/2021- EMBx: Pathology: A: Uterus, endometrium, biopsy, benign endocervical glands and squamous tissue. No endometrial tissue identified.  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) (1 fragment) - Low grade squamous intraepithelial lesion (CIN 1 with HPV effect) (multiple fragments) - Benign endocervical glands - No stroma sampled for the evaluation of invasive carcinoma    Problem List: Patient Active Problem List   Diagnosis Date Noted   Morbid obesity with BMI of 60.0-69.9, adult ([place]) 11/19/2020   Positive ANA (antinuclear antibody)    Ds DNA antibody positive    Hypomagnesemia    Constipation    Pica    Dehydration    Hypokalemia    COPD (chronic obstructive pulmonary disease) ([place]) 04/13/2020   Hypertension 04/13/2020   Cervical high risk HPV (human papillomavirus) test positive 10/17/2019   Abnormal uterine bleeding (AUB) 10/17/2019   Primary osteoarthritis of knee 03/12/2019   Pancreatitis 07/28/2018   Diabetes mellitus with complication ([place]) [id number]   Arthritis 09/14/2017   Kidney stone on left side 03/10/2016   Tobacco use disorder 02/28/2013   Mild intermittent asthma without complication [id number]   Morbid obesity ([place]) 05/24/2010   Obstructive sleep apnea 05/24/2010    Past Medical History: Past Medical History:  Diagnosis Date   Arthritis    COPD (chronic obstructive pulmonary disease) ([facility])    Diabetes mellitus without complication ([place])    Hypertension    Morbid obesity ([place])     Past Surgical History: Past Surgical History:  Procedure Laterality Date   CESAREAN SECTION      Past Gynecologic History:  Menarche: {NUMBERS 0-12:18577} Menstrual details: Lasts {NUMBERS 0-12:18577} days, {DESC; menstrual flow:21146} Menses regular: {yes no:20984} Last Menstrual Period: *** History of OCP/HRT use: *** History of Abnormal pap: {yes no:20984}, {PAP RESULT:21077} Last pap: {Blank multiple:19196} History  of STDs: {STD history:20597} Contraception: {contraceptive methods:21883} Sexually active: {yes no unk:32069}  OB History:  OB History  Gravida Para Term Preterm AB Living  1             SAB IAB Ectopic Multiple Live Births               # Outcome Date GA Lbr Len/2nd Weight Sex Delivery Anes PTL Lv  1 Gravida             Family History: Family History  Problem Relation Age of Onset   Diabetes Mellitus II Mother    Hypertension Mother    Hypertension Father     Social History: Social History   Socioeconomic History   Marital status: Single    Spouse name: Not on file   Number of children: Not on file   Years of education: Not on file   Highest education level: Not on file  Occupational History   Not on file  Tobacco Use   Smoking status: Every Day    Types: Cigarettes   Smokeless tobacco: Never  Vaping Use   Vaping Use: Never used  Substance and Sexual Activity   Alcohol use: No   Drug use: Never   Sexual activity: Not on file  Other Topics Concern   Not on file  Social History Narrative   Not on file   Social Determinants of Health   Financial Resource Strain: Not on file  Food Insecurity: Not on file  Transportation Needs: Not on file  Physical Activity: Not on file  Stress: Not on file  Social Connections: Not on file  Intimate Partner Violence: Not on file    Allergies: Allergies  Allergen Reactions   Ciprofloxacin Other (See Comments)    Body stiffens/ tightens     Current Medications: Current Outpatient Medications  Medication Sig Dispense Refill   acetaminophen (TYLENOL) 650 MG CR tablet Take 650 mg by mouth every 8 (eight) hours as needed for pain.     albuterol (VENTOLIN HFA) 108 (90 Base) MCG/ACT inhaler Inhale 2 puffs into the lungs every 4 (four) hours as needed for wheezing or shortness of breath. Prn wheezing, cough and SOB 18 g 0   amLODipine (NORVASC) 10 MG tablet Take 1 tablet (10 mg total) by mouth daily. 30 tablet 0   atorvastatin (LIPITOR) 10 MG tablet Take 10 mg by mouth every morning.     azithromycin (ZITHROMAX Z-PAK) 250 MG tablet Take 1 tablet (250 mg total) by mouth daily. Take 2 tablets on day one  6 tablet 0   CREON 36000-114000 units CPEP capsule Take 72,000 Units by mouth 3 (three) times daily.     magnesium oxide (MAG-OX) 400 (241.3 Mg) MG tablet Take 1 tablet (400 mg total) by mouth 2 (two) times daily. 60 tablet 0   metFORMIN (GLUCOPHAGE) 500 MG tablet Take 1 tablet (500 mg total) by mouth 2 (two) times daily with a meal. 60 tablet 0   metoprolol tartrate (LOPRESSOR) 100 MG tablet Take 1 tablet (100 mg total) by mouth 2 (two) times daily. 60 tablet 0   ondansetron (ZOFRAN) 4 MG tablet Take 1 tablet (4 mg total) by mouth every 8 (eight) hours as needed for nausea or vomiting. 20 tablet 0   polyethylene glycol (MIRALAX / GLYCOLAX) 17 g packet Take 17 g by mouth daily as needed for severe constipation. 30 each 0   Simethicone 125 MG CAPS Take 125 mg by mouth every 6 (six) hours  as needed.     No current facility-administered medications for this visit.    Review of Systems General: {Findings; ROS constitutional:30497::"negative for","fevers","chills","fatigue","changes in sleep","changes in weight or appetite"} Skin: {ros; skin:310673::"negative for","changes in color, texture, moles or lesions"} Eyes: {Findings; ROS eyes:30500::"negative for","changes in vision","pain","diplopia"} HEENT: {Findings; ROS HEENT:30502::"negative for","change in hearing","pain","discharge","tinnitus","vertigo","voice changes","sore throat","neck masses"} Breasts: {ros breast:311036} Pulmonary: {Findings; ROS respiratory:30504::"negative for","dyspnea","orthopnea","productive cough"} Cardiac: {Findings; ROS cardiac:30506::"negative for","palpitations","syncope","pain","discomfort","pressure"} Gastrointestinal: {Findings; ROS gastrointestinal:30513::"negative for","dysphagia","nausea","vomiting","jaundice","pain","constipation","diarrhea","hematemesis","hematochezia"} Genitourinary/Sexual: {Findings; ROS genitourinary:30516::"negative  for","dysuria","discharge","hesitancy","nocturia","retention","stones","infections","STD's","incontinence"} Ob/Gyn: {Gyn ros:5267:: "negative for", "irregular bleeding", "pain"} Musculoskeletal: {Findings; ROS musculoskeletal:30524::"negative for","pain","stiffness","swelling","range of motion limitation"} Hematology: {Heme ros:13436:: "negative for", "easy bruising", "bleeding"} Neurologic/Psych: {Findings; ROS neuro:30532::"negative for","headaches","seizures","paralysis","weakness","tremor","change in gait","change in sensation","mood swings","depression","anxiety","change in memory"}  Objective:  Physical Examination:  LMP 02/12/2022 (Approximate)     ECOG Performance Status: {DESC; ECOG PERFORMANCE STATUS (NQF 385):19948:p}  General appearance: {Exam; general:16600::"alert","cooperative","appears stated age"} HEENT: {HEENT:32174::"mucus membranes moist","no mucosal lesions","posterior pharynx benign","PERRL","EOMI","sclera clear"} Neck: {neck:32177::"supple","no thyroid enlargement or cervical adenopathy"} Lymph node survey: {pe lymph [id number]:: "axillary", "inguinal", "supraclavicular", "non-palpable"} Cardiovascular: {cardiovascular:32178::"regular rate and rhythm","without murmurs, rubs or gallops"} Respiratory: {respiratory:32179::"clear to auscultation"} Breast exam: {pe breast exam:315056::"breasts appear normal, no suspicious masses, no skin or nipple changes or axillary nodes"}. Abdomen: {GI:32181::"no palpable masses","no hernias","well healed incision","soft","nontender, nondistended","bowel sounds present","without hepatosplenomegaly"} Back: {back exam:311380::"inspection of back is normal"} Extremities: {PE; Extremities Gen Med:33669::"no lower extremity edema"} Skin exam - {skin exam:315960::"normal coloration and turgor, no rashes, no suspicious skin lesions noted"}. Neurological exam reveals [id number], oriented, normal speech, no focal findings or movement  disorder noted"}.  Pelvic: EGBUS: no lesions Cervix: no lesions, nontender, mobile Vagina: no lesions, no discharge or bleeding Uterus: normal size, nontender, mobile Adnexa: no palpable masses Rectovaginal: confirmatory  Lab Review Labs on site today: ***  Radiologic Imaging: ***    Assessment:  Tamara Higgins is a 47 y.o. female diagnosed with {insert grade of cancer if applicable} {GYN ONC [id number].   Medical co-morbidities complicating care: [id number].  Plan:   Problem List Items Addressed This Visit   None   We discussed options for management including ***. Based on *** , we recommend {GYN ONC Reccommendation:32802}.  {Insert risk statement for surgery if preop, .gynoncsurrisk} Suggested return to clinic in  {1-10:18281} {units:10146}.    The patient's diagnosis, an outline of the further diagnostic and laboratory studies which will be required, the recommendation for surgery, and alternatives were discussed with her and her accompanying family members.  All questions were answered to their satisfaction.  A total of *** minutes were spent with the patient/family today; ***% was spent in education, counseling and coordination of care for {GYN ONC Diagnosis:32727}.    Verlon Au, NP    CC:  Benj[address], [address] [place],  [place] [address] [phone]

## 2022-03-07 ENCOUNTER — Encounter: Payer: Self-pay | Admitting: Obstetrics and Gynecology

## 2022-03-15 ENCOUNTER — Telehealth: Payer: Self-pay

## 2022-03-15 NOTE — Telephone Encounter (Signed)
Received another referral for abnormal uterine bleeding from Dr. Melburn Popper office. She was originally scheduled for 03/01/22 and did not show. She was left a voicemail to return call for reschedule and we did not hear back. She has been cleared for rescheduling.

## 2022-04-05 ENCOUNTER — Inpatient Hospital Stay: Payer: BC Managed Care – PPO | Attending: Obstetrics and Gynecology

## 2022-04-05 NOTE — Progress Notes (Deleted)
Gynecologic Oncology Consult Visit   Referring Provider: Benjaman Kindler, MD  Chief Concern: abnormal uterine bleedin   Subjective:  Tamara Higgins is a 47 y.o. female who is seen in consultation from Dr. Leafy Ro for ***. Tamara Higgins is a 47 y.o. here to evaluate abnormal bleeding. Patient's last menstrual period was 12/24/2021. However, she is bleeding daily. Previously was placed on provera once daily, which improved her bleeding. She was then taken for a D&C and a Mirena placed, and she stopped the provera. This was in approx 2021. She did have very heavy periods that lasted for 3 weeks, which improved after the Mirena, but is now bleeding like a normal period every day.  Her records are not available on [facility], though she has had care at [facility], except as below.  Body mass index is 67.49 kg/m. She is a smoker. Hx of HTN, and pericarditis earlier this year.  Mirena IUD In place since 01/2020.  Her mom had a hysterectomy in late 54s, prior to menopause.  AUB: - s/p HSC, D&C, and LNG-IUD placement in December 2021 with pathology as below - Persistent bleeding despite IUD placement. No strings visualized on exam today. - TVUS ordered to evaluate uterus and adnexa in 10/2019: wnl, no right ovary seen. CT for RLQ pain in 11/23 confirmed normal pelvis.  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 The patient has not had an exam since this time. - Repeat co-testing, colposcopy and ECC performed 05/2021 with no endometrial tissue sampled, but CIN 3 on ECC.  Prior Pathology: 09/2019: possible polyp, inactive endometrium, chronic endometritis 12/21: CIN 3 endocervical, cannot evaluate for invasive malignancy. P16 possitive. +Endometrial/endocervical polyp 05/2021 pap smear: ASCUS but P16 positive, suggestive of HSIL   Problem List: Patient Active Problem List   Diagnosis Date Noted   Morbid obesity with BMI of 60.0-69.9, adult (Tamara Higgins) 11/19/2020   Positive ANA (antinuclear antibody)    Ds DNA antibody positive    Hypomagnesemia    Constipation    Pica    Dehydration    Hypokalemia    COPD (chronic obstructive pulmonary disease) ([place]) 04/13/2020   Hypertension 04/13/2020   Cervical high risk HPV (human papillomavirus) test positive 10/17/2019   Abnormal uterine bleeding (AUB) 10/17/2019   Primary osteoarthritis of knee 03/12/2019   Pancreatitis 07/28/2018   Diabetes mellitus with complication ([place]) [id number]   Arthritis 09/14/2017   Kidney stone on left side 03/10/2016   Tobacco use disorder 02/28/2013   Mild intermittent asthma without complication [id number]   Morbid obesity ([place]) 05/24/2010   Obstructive sleep apnea 05/24/2010    Past Medical History: Past Medical History:  Diagnosis Date   Arthritis    COPD (chronic obstructive pulmonary disease) ([place])    Diabetes mellitus without complication ([place])    Hypertension    Morbid obesity ([place])     Past Surgical History: Past Surgical History:  Procedure Laterality Date   CESAREAN SECTION  Past Gynecologic History:  Menarche: {NUMBERS 0-12:18577} Menstrual details: Lasts {NUMBERS 0-12:18577} days Menses regular: {YES NO:22349} Last Menstrual Period: *** History of OCP/HRT use: *** History of Abnormal pap: {YES NO:22349}, {Findings; lab pap smear results:16707::"no abnormalities"} Last  pap: {Blank multiple:19196} History of STDs: {STD history:20597} Contraception: {PLAN CONTRACEPTION:313102} Sexually active: {Responses; yes/no/not asked:9010}  OB History:  OB History  Gravida Para Term Preterm AB Living  1            SAB IAB Ectopic Multiple Live Births               # Outcome Date GA Lbr Len/2nd Weight Sex Delivery Anes PTL Lv  1 Gravida             Family History: Family History  Problem Relation Age of Onset   Diabetes Mellitus II Mother    Hypertension Mother    Hypertension Father     Social History: Social History   Socioeconomic History   Marital status: Single    Spouse name: Not on file   Number of children: Not on file   Years of education: Not on file   Highest education level: Not on file  Occupational History   Not on file  Tobacco Use   Smoking status: Every Day    Types: Cigarettes   Smokeless tobacco: Never  Vaping Use   Vaping Use: Never used  Substance and Sexual Activity   Alcohol use: No   Drug use: Never   Sexual activity: Not on file  Other Topics Concern   Not on file  Social History Narrative   Not on file   Social Determinants of Health   Financial Resource Strain: Not on file  Food Insecurity: Not on file  Transportation Needs: Not on file  Physical Activity: Not on file  Stress: Not on file  Social Connections: Not on file  Intimate Partner Violence: Not on file    Allergies: Allergies  Allergen Reactions   Ciprofloxacin Other (See Comments)    Body stiffens/ tightens     Current Medications: Current Outpatient Medications  Medication Sig Dispense Refill   acetaminophen (TYLENOL) 650 MG CR tablet Take 650 mg by mouth every 8 (eight) hours as needed for pain.     albuterol (VENTOLIN HFA) 108 (90 Base) MCG/ACT inhaler Inhale 2 puffs into the lungs every 4 (four) hours as needed for wheezing or shortness of breath. Prn wheezing, cough and SOB 18 g 0   amLODipine (NORVASC) 10 MG tablet Take 1 tablet (10  mg total) by mouth daily. 30 tablet 0   atorvastatin (LIPITOR) 10 MG tablet Take 10 mg by mouth every morning.     azithromycin (ZITHROMAX Z-PAK) 250 MG tablet Take 1 tablet (250 mg total) by mouth daily. Take 2 tablets on day one 6 tablet 0   CREON 36000-114000 units CPEP capsule Take 72,000 Units by mouth 3 (three) times daily.     magnesium oxide (MAG-OX) 400 (241.3 Mg) MG tablet Take 1 tablet (400 mg total) by mouth 2 (two) times daily. 60 tablet 0   metFORMIN (GLUCOPHAGE) 500 MG tablet Take 1 tablet (500 mg total) by mouth 2 (two) times daily with a meal. 60 tablet 0   metoprolol tartrate (LOPRESSOR) 100 MG tablet Take 1 tablet (100 mg total) by mouth 2 (two) times daily. 60 tablet 0   ondansetron (ZOFRAN) 4 MG tablet Take 1 tablet (4 mg total) by mouth every 8 (eight) hours as needed for nausea or  vomiting. 20 tablet 0   polyethylene glycol (MIRALAX / GLYCOLAX) 17 g packet Take 17 g by mouth daily as needed for severe constipation. 30 each 0   Simethicone 125 MG CAPS Take 125 mg by mouth every 6 (six) hours as needed.     No current facility-administered medications for this visit.    Review of Systems General: {Findings; ROS constitutional:30497::"negative for","fevers","chills","fatigue","changes in sleep","changes in weight or appetite"} Skin: {ros; skin:310673::"negative for","changes in color, texture, moles or lesions"} Eyes: {Findings; ROS eyes:30500::"negative for","changes in vision","pain","diplopia"} HEENT: {Findings; ROS HEENT:30502::"negative for","change in hearing","pain","discharge","tinnitus","vertigo","voice changes","sore throat","neck masses"} Breasts: {ros breast:311036} Pulmonary: {Findings; ROS respiratory:30504::"negative for","dyspnea","orthopnea","productive cough"} Cardiac: {Findings; ROS cardiac:30506::"negative for","palpitations","syncope","pain","discomfort","pressure"} Gastrointestinal: {Findings; ROS gastrointestinal:30513::"negative  for","dysphagia","nausea","vomiting","jaundice","pain","constipation","diarrhea","hematemesis","hematochezia"} Genitourinary/Sexual: {Findings; ROS genitourinary:30516::"negative for","dysuria","discharge","hesitancy","nocturia","retention","stones","infections","STD's","incontinence"} Ob/Gyn: {Gyn ros:5267:: "negative for", "irregular bleeding", "pain"} Musculoskeletal: {Findings; ROS musculoskeletal:30524::"negative for","pain","stiffness","swelling","range of motion limitation"} Hematology: {Heme ros:13436:: "negative for", "easy bruising", "bleeding"} Neurologic/Psych: {Findings; ROS neuro:30532::"negative for","headaches","seizures","paralysis","weakness","tremor","change in gait","change in sensation","mood swings","depression","anxiety","change in memory"}  Objective:  Physical Examination:  There were no vitals taken for this visit.   ECOG Performance Status: {DESC; ECOG PERFORMANCE STATUS (NQF 385):19948:p}  General appearance: {Exam; general:16600::"alert","cooperative","appears stated age"} HEENT:{exam; XT:2158142; atraumatic normocephalic Lymph node survey: non-palpable, axillary, inguinal, supraclavicular Cardiovascular: regular rate and rhythm Respiratory: normal air entry, lungs clear to auscultation Breast exam: {pe breast exam:315056::"breasts appear normal, no suspicious masses, no skin or nipple changes or axillary nodes"}. Abdomen: soft, non-tender, without masses or organomegaly, no hernias, no ascites and *** well healed incision Back: inspection of back is normal Extremities: {Exam; extremity:5109} Skin exam - no rashes. Neurological exam reveals alert, oriented, normal speech, no focal findings or movement disorder noted  Pelvic: exam chaperoned by nurse; *** Vulva: normal appearing vulva with no masses, tenderness or lesions; Vagina: {exam; vagina:12200:p}; Adnexa: normal adnexa in size, nontender and no masses; Uterus: uterus is normal size, shape, consistency  and nontender; Cervix: {exam; cervix:14595}; Rectal: not indicated    Lab Review Labs on site today: ***  Radiologic Imaging: ***    Assessment:  AERIELLA LEMER is a 47 y.o. female diagnosed with {insert grade of cancer if applicable}  {Blank 0000000 cancer","cervical cancer","endometrial cancer","pelvic mass","dysplasia","vulvar cancer","***"}. Medical co-morbidities complicating care: {Blank multiple:19196::"***","HTN","diabetes","obesity","prior abdominal surgery","immunocompromised"}.  Plan:   Problem List Items Addressed This Visit   None   We discussed options for management including ***. Based on *** , we recommend {Blank single:19197::"definitive surgical evaluation","neoadjuvant chemotherapy","radiation","concurrent chemotherapy and radiation","further testing","***"}.  {Insert risk statement for surgery if preop, .gynoncsurrisk} Suggested return to clinic in  {1-10:18281} {units:10146}.    The patient's diagnosis, an outline of the further diagnostic and laboratory studies which will be required, the recommendation, and alternatives were discussed.  All questions were answered to the patient's satisfaction.  A total of *** minutes were spent with the patient/family today; ***% was spent in education, counseling and coordination of care for {Blank single:19197::"ovarian cancer","cervical cancer","endometrial cancer","pelvic mass","dysplasia","vulvar cancer","***"}.    Asia Dusenbury Gaetana Michaelis, MD    CC:  Benjaman Kindler, [address],  [place] [address] [phone]

## 2022-04-07 NOTE — Progress Notes (Signed)
Did not show for second scheduled gyn oncology appointment. Referral has been closed.

## 2022-09-19 ENCOUNTER — Encounter: Payer: Self-pay | Admitting: Emergency Medicine

## 2022-09-19 ENCOUNTER — Ambulatory Visit
Admission: EM | Admit: 2022-09-19 | Discharge: 2022-09-19 | Disposition: A | Discharge status: Home / Self Care | Payer: BC Managed Care – PPO | Attending: Emergency Medicine | Admitting: Emergency Medicine

## 2022-09-19 DIAGNOSIS — Z1152 Encounter for screening for COVID-19: Secondary | ICD-10-CM | POA: Diagnosis not present

## 2022-09-19 DIAGNOSIS — R519 Headache, unspecified: Secondary | ICD-10-CM | POA: Diagnosis not present

## 2022-09-19 LAB — SARS CORONAVIRUS 2 BY RT PCR: SARS Coronavirus 2 by RT PCR: NEGATIVE

## 2022-09-19 NOTE — Discharge Instructions (Addendum)
Your COVID test is negative.  Your elevated blood pressure may very well be accounting for your headache.  Use over-the-counter Tylenol and or ibuprofen according to package instructions as needed for headache pain.  Make sure you are taking your antihypertensives.  If you develop any new or worsening symptoms please return for reevaluation.

## 2022-09-19 NOTE — ED Provider Notes (Signed)
[facility]    CSN: [id number] Arrival date & time: 09/19/22  1210      History   Chief Complaint Chief Complaint  Patient presents with   Headache    HPI Tamara Higgins is a 47 y.o. female.   HPI  46 year old female with a past medical history significant for COPD, diabetes, hypertension, obesity, and arthritis presents for COVID testing in the setting of a COVID exposure.  Her son tested positive for COVID this morning with a home test.  The patient reports that she has had a headache but she is attributing that to her blood pressure being elevated and she denies fever, runny nose, nasal congestion, cough, shortness breath, or wheezing.  She is here with her other 2 children for testing.  Past Medical History:  Diagnosis Date   Arthritis    COPD (chronic obstructive pulmonary disease) ([facility])    Diabetes mellitus without complication ([facility])    Hypertension    Morbid obesity ([facility])     Patient Active Problem List   Diagnosis Date Noted   Morbid obesity with BMI of 60.0-69.9, adult ([facility]) 11/19/2020   Positive ANA (antinuclear antibody)    Ds DNA antibody positive    Hypomagnesemia    Constipation    Pica    Dehydration    Hypokalemia    COPD (chronic obstructive pulmonary disease) ([facility]) 04/13/2020   Hypertension 04/13/2020   Cervical high risk HPV (human papillomavirus) test positive 10/17/2019   Abnormal uterine bleeding (AUB) 10/17/2019   Primary osteoarthritis of knee 03/12/2019   Pancreatitis 07/28/2018   Diabetes mellitus with complication ([facility]) 11/19/2017   Arthritis 09/14/2017   Kidney stone on left side 03/10/2016   Tobacco use disorder 02/28/2013   Mild intermittent asthma without complication 01/18/2011   Morbid obesity (H[facility]) 05/24/2010   Obstructive sleep apnea 05/24/2010    Past Surgical History:  Procedure Laterality Date   CESAREAN SECTION      OB History     Gravida  1   Para      Term      Preterm      AB      Living          SAB      IAB      Ectopic      Multiple      Live Births               Home Medications    Prior to Admission medications   Medication Sig Start Date End Date Taking? Authorizing Provider  acetaminophen (TYLENOL) 650 MG CR tablet Take 650 mg by mouth every 8 (eight) hours as needed for pain.    [provider]  albuterol (VENTOLIN HFA) 108 (90 Base) MCG/ACT inhaler Inhale 2 puffs into the lungs every 4 (four) hours as needed for wheezing or shortness of breath. Prn wheezing, cough and SOB 03/22/21   Rodriguez-Southworth, Nettie Elm, PA-C  amLODipine (NORVASC) 10 MG tablet Take 1 tablet (10 mg total) by mouth daily. 07/31/18   Ramonita Lab, MD  atorvastatin (LIPITOR) 10 MG tablet Take 10 mg by mouth every morning. 10/11/20   [provider]  azithromycin (ZITHROMAX Z-PAK) 250 MG tablet Take 1 tablet (250 mg total) by mouth daily. Take 2 tablets on day one 02/14/22   Katha Cabal, DO  CREON 36000-114000 units CPEP capsule Take 72,000 Units by mouth 3 (three) times daily. 08/02/20   [provider]  magnesium oxide (MAG-OX) 400 (241.3  Mg) MG tablet Take 1 tablet (400 mg total) by mouth 2 (two) times daily. 04/18/20   Alford Highland, MD  metFORMIN (GLUCOPHAGE) 500 MG tablet Take 1 tablet (500 mg total) by mouth 2 (two) times daily with a meal. 04/18/20   Alford Highland, MD  metoprolol tartrate (LOPRESSOR) 100 MG tablet Take 1 tablet (100 mg total) by mouth 2 (two) times daily. 04/18/20   Alford Highland, MD  ondansetron (ZOFRAN) 4 MG tablet Take 1 tablet (4 mg total) by mouth every 8 (eight) hours as needed for nausea or vomiting. 04/18/20   Alford Highland, MD  polyethylene glycol (MIRALAX / GLYCOLAX) 17 g packet Take 17 g by mouth daily as needed for severe constipation. 04/18/20   Alford Highland, MD  Simethicone 125 MG CAPS Take 125 mg by mouth every 6 (six) hours as needed.    [provider]    Family History Family History  Problem Relation Age  of Onset   Diabetes Mellitus II Mother    Hypertension Mother    Hypertension Father     Social History Social History   Tobacco Use   Smoking status: Every Day    Types: Cigarettes   Smokeless tobacco: Never  Vaping Use   Vaping status: Never Used  Substance Use Topics   Alcohol use: No   Drug use: Never     Allergies   Ciprofloxacin   Review of Systems Review of Systems  Constitutional:  Negative for fever.  HENT:  Negative for congestion, ear pain, rhinorrhea and sore throat.   Respiratory:  Negative for cough, shortness of breath and wheezing.   Neurological:  Positive for headaches.     Physical Exam Triage Vital Signs ED Triage Vitals  Encounter Vitals Group     BP      Systolic BP Percentile      Diastolic BP Percentile      Pulse      Resp      Temp      Temp src      SpO2      Weight      Height      Head Circumference      Peak Flow      Pain Score      Pain Loc      Pain Education      Exclude from Growth Chart    No data found.  Updated Vital Signs BP (!) 172/109 (BP Location: Left Arm)   Pulse 98   Temp 98.7 F (37.1 C) (Oral)   Resp 18   LMP  (LMP Unknown)   SpO2 96%   Visual Acuity Right Eye Distance:   Left Eye Distance:   Bilateral Distance:    Right Eye Near:   Left Eye Near:    Bilateral Near:     Physical Exam Vitals and nursing note reviewed.  Constitutional:      Appearance: Normal appearance. She is obese. She is not ill-appearing.  HENT:     Head: Normocephalic and atraumatic.     Right Ear: Tympanic membrane, ear canal and external ear normal. There is no impacted cerumen.     Left Ear: Tympanic membrane, ear canal and external ear normal. There is no impacted cerumen.     Nose: Nose normal. No congestion or rhinorrhea.     Mouth/Throat:     Mouth: Mucous membranes are moist.     Pharynx: Oropharynx is clear. No oropharyngeal exudate or posterior oropharyngeal erythema.  Cardiovascular:     Rate and  Rhythm: Normal rate and regular rhythm.     Pulses: Normal pulses.     Heart sounds: Normal heart sounds. No murmur heard.    No friction rub. No gallop.  Pulmonary:     Breath sounds: Normal breath sounds. No wheezing, rhonchi or rales.  Skin:    General: Skin is warm and dry.     Capillary Refill: Capillary refill takes less than 2 seconds.     Findings: No rash.  Neurological:     General: No focal deficit present.     Mental Status: She is alert and oriented to person, place, and time.      UC Treatments / Results  Labs (all labs ordered are listed, but only abnormal results are displayed) Labs Reviewed  SARS CORONAVIRUS 2 BY RT PCR    EKG   Radiology No results found.  Procedures Procedures (including critical care time)  Medications Ordered in UC Medications - No data to display  Initial Impression / Assessment and Plan / UC Course  I have reviewed the triage vital signs and the nursing notes.  Pertinent labs & imaging results that were available during my care of the patient were reviewed by me and considered in my medical decision making (see chart for details).   Patient is a nontoxic-appearing 47 year old female presenting for COVID testing in the setting of a recent exposure.  Her son tested positive for COVID today.  She believes that he may have contracted it when [place] were [place] over the [place].  She reports that that is when her oldest son developed respiratory symptoms.  She is not having any at present and her upper and lower respiratory exam is benign.  Given her recent exposure I will order a COVID PCR.  COVID PCR is negative.  I will discharge patient home with diagnosis of headache and have her use over-the-counter Tylenol and/or ibuprofen according to package directions as needed for pain.   Final Clinical Impressions(s) / UC Diagnoses   Final diagnoses:  Acute nonintractable headache, unspecified headache type      Discharge Instructions      Your COVID test is negative.  Your elevated blood pressure may very well be accounting for your headache.  Use over-the-counter Tylenol and or ibuprofen according to package instructions as needed for headache pain.  Make sure you are taking your antihypertensives.  If you develop any new or worsening symptoms please return for reevaluation.     ED Prescriptions   None    PDMP not reviewed this encounter.   Becky Augusta, NP 09/19/22 1330

## 2022-09-19 NOTE — ED Triage Notes (Signed)
Pt presents with a headache x 2 days. Her son tested positive for Covid today.

## 2022-12-23 DIAGNOSIS — I110 Hypertensive heart disease with heart failure: Secondary | ICD-10-CM | POA: Diagnosis not present

## 2022-12-23 DIAGNOSIS — F321 Major depressive disorder, single episode, moderate: Secondary | ICD-10-CM | POA: Diagnosis not present

## 2022-12-23 DIAGNOSIS — E119 Type 2 diabetes mellitus without complications: Secondary | ICD-10-CM | POA: Diagnosis not present

## 2022-12-23 DIAGNOSIS — Z833 Family history of diabetes mellitus: Secondary | ICD-10-CM | POA: Diagnosis not present

## 2022-12-23 DIAGNOSIS — Z5948 Other specified lack of adequate food: Secondary | ICD-10-CM | POA: Diagnosis not present

## 2022-12-23 DIAGNOSIS — I509 Heart failure, unspecified: Secondary | ICD-10-CM | POA: Diagnosis not present

## 2022-12-23 DIAGNOSIS — K739 Chronic hepatitis, unspecified: Secondary | ICD-10-CM | POA: Diagnosis not present

## 2022-12-23 DIAGNOSIS — Z8249 Family history of ischemic heart disease and other diseases of the circulatory system: Secondary | ICD-10-CM | POA: Diagnosis not present

## 2022-12-23 DIAGNOSIS — R32 Unspecified urinary incontinence: Secondary | ICD-10-CM | POA: Diagnosis not present

## 2022-12-23 DIAGNOSIS — K219 Gastro-esophageal reflux disease without esophagitis: Secondary | ICD-10-CM | POA: Diagnosis not present

## 2022-12-23 DIAGNOSIS — Z87891 Personal history of nicotine dependence: Secondary | ICD-10-CM | POA: Diagnosis not present

## 2023-03-13 ENCOUNTER — Encounter: Payer: Self-pay | Admitting: Emergency Medicine

## 2023-03-13 ENCOUNTER — Ambulatory Visit
Admission: EM | Admit: 2023-03-13 | Discharge: 2023-03-13 | Disposition: A | Discharge status: Home / Self Care | Payer: BC Managed Care – PPO | Attending: Emergency Medicine | Admitting: Emergency Medicine

## 2023-03-13 ENCOUNTER — Ambulatory Visit (INDEPENDENT_AMBULATORY_CARE_PROVIDER_SITE_OTHER): Payer: BC Managed Care – PPO

## 2023-03-13 DIAGNOSIS — J45901 Unspecified asthma with (acute) exacerbation: Secondary | ICD-10-CM | POA: Diagnosis present

## 2023-03-13 DIAGNOSIS — J101 Influenza due to other identified influenza virus with other respiratory manifestations: Secondary | ICD-10-CM | POA: Diagnosis present

## 2023-03-13 DIAGNOSIS — R051 Acute cough: Secondary | ICD-10-CM | POA: Diagnosis not present

## 2023-03-13 DIAGNOSIS — R079 Chest pain, unspecified: Secondary | ICD-10-CM | POA: Diagnosis present

## 2023-03-13 DIAGNOSIS — R0602 Shortness of breath: Secondary | ICD-10-CM | POA: Diagnosis present

## 2023-03-13 LAB — RESP PANEL BY RT-PCR (FLU A&B, COVID) ARPGX2
Influenza A by PCR: POSITIVE — AB
Influenza B by PCR: NEGATIVE
SARS Coronavirus 2 by RT PCR: NEGATIVE

## 2023-03-13 LAB — GROUP A STREP BY PCR: Group A Strep by PCR: NOT DETECTED

## 2023-03-13 MED ORDER — PREDNISONE 20 MG PO TABS: 40.0000 mg | ORAL_TABLET | Freq: Every day | ORAL | 0 refills | Status: AC

## 2023-03-13 MED ORDER — OSELTAMIVIR PHOSPHATE 75 MG PO CAPS: 75.0000 mg | ORAL_CAPSULE | Freq: Two times a day (BID) | ORAL | 0 refills | Status: AC

## 2023-03-13 MED ORDER — PROMETHAZINE-DM 6.25-15 MG/5ML PO SYRP: 5.0000 mL | ORAL_SOLUTION | Freq: Four times a day (QID) | ORAL | 0 refills | Status: DC | PRN

## 2023-03-13 NOTE — Discharge Instructions (Addendum)
-  You have influenza A.  Other workup was negative. Isolate till fever free 24 hours and feeling better. - I sent Tamiflu to the pharmacy. - Your asthma is a little flared up so I sent prednisone.  Use your inhaler at home. - Also sent cough medicine. - Flu symptoms can last for couple weeks. - You need to be seen again if you have uncontrolled fever, or not breaking fever in a few days, worsening chest discomfort, increased breathing difficulty or weakness.

## 2023-03-13 NOTE — ED Triage Notes (Signed)
Patient presents with c/o dyspnea, chest pressure, fever (101.7 at home), dizziness, sore throat, nasal congestion x 1 day.

## 2023-03-13 NOTE — ED Provider Notes (Signed)
[facility]    CSN: [id number] Arrival date & time: 03/13/23  0957      History   Chief Complaint Chief Complaint  Patient presents with   Nasal Congestion   Fever   Dizziness   Shortness of Breath   Chest Pain   Sore Throat    HPI Tamara Higgins is a 48 y.o. female with history of asthma/COPD, diabetes, hypertension, morbid obesity, obstructive sleep apnea, hypertension, autoimmune disease and former tobacco abuse.  Patient presents today with family for fever up to 101.7 degrees, fatigue, body aches, headaches, cough, congestion, chest pain/pressure, shortness of breath, dizziness, and sore throat that began last night.  Denies any sick contacts.  Has an inhaler at home but has not tried to use it.  Has not taken any over-the-counter medication for symptoms.  No other complaints or concerns.  HPI  Past Medical History:  Diagnosis Date   Arthritis    COPD (chronic obstructive pulmonary disease) (HCC)    Diabetes mellitus without complication (HCC)    Hypertension    Morbid obesity (HCC)     Patient Active Problem List   Diagnosis Date Noted   Morbid obesity with BMI of 60.0-69.9, adult (HCC) 11/19/2020   Positive ANA (antinuclear antibody)    Ds DNA antibody positive    Hypomagnesemia    Constipation    Pica    Dehydration    Hypokalemia    COPD (chronic obstructive pulmonary disease) (HCC) 04/13/2020   Hypertension 04/13/2020   Cervical high risk HPV (human papillomavirus) test positive 10/17/2019   Abnormal uterine bleeding (AUB) 10/17/2019   Primary osteoarthritis of knee 03/12/2019   Pancreatitis 07/28/2018   Diabetes mellitus with complication (HCC) 11/19/2017   Arthritis 09/14/2017   Kidney stone on left side 03/10/2016   Tobacco use disorder 02/28/2013   Mild intermittent asthma without complication 01/18/2011   Morbid obesity (HCC) 05/24/2010   Obstructive sleep apnea 05/24/2010    Past Surgical History:  Procedure Laterality Date    CESAREAN SECTION      OB History     Gravida  1   Para      Term      Preterm      AB      Living         SAB      IAB      Ectopic      Multiple      Live Births               Home Medications    Prior to Admission medications   Medication Sig Start Date End Date Taking? Authorizing Provider  oseltamivir (TAMIFLU) 75 MG capsule Take 1 capsule (75 mg total) by mouth every 12 (twelve) hours for 5 days. 03/13/23 03/18/23 Yes Shirlee Latch, PA-C  predniSONE (DELTASONE) 20 MG tablet Take 2 tablets (40 mg total) by mouth daily for 5 days. 03/13/23 03/18/23 Yes Shirlee Latch, PA-C  promethazine-dextromethorphan (PROMETHAZINE-DM) 6.25-15 MG/5ML syrup Take 5 mLs by mouth 4 (four) times daily as needed. 03/13/23  Yes Shirlee Latch, PA-C  acetaminophen (TYLENOL) 650 MG CR tablet Take 650 mg by mouth every 8 (eight) hours as needed for pain.    [provider]  albuterol (VENTOLIN HFA) 108 (90 Base) MCG/ACT inhaler Inhale 2 puffs into the lungs every 4 (four) hours as needed for wheezing or shortness of breath. Prn wheezing, cough and SOB 03/22/21   Rodriguez-Southworth, Nettie Elm, PA-C  amLODipine (  NORVASC) 10 MG tablet Take 1 tablet (10 mg total) by mouth daily. 07/31/18   Ramonita Lab, MD  atorvastatin (LIPITOR) 10 MG tablet Take 10 mg by mouth every morning. 10/11/20   [provider]  CREON 36000-114000 units CPEP capsule Take 72,000 Units by mouth 3 (three) times daily. 08/02/20   [provider]  magnesium oxide (MAG-OX) 400 (241.3 Mg) MG tablet Take 1 tablet (400 mg total) by mouth 2 (two) times daily. 04/18/20   Alford Highland, MD  metFORMIN (GLUCOPHAGE) 500 MG tablet Take 1 tablet (500 mg total) by mouth 2 (two) times daily with a meal. 04/18/20   Alford Highland, MD  metoprolol tartrate (LOPRESSOR) 100 MG tablet Take 1 tablet (100 mg total) by mouth 2 (two) times daily. 04/18/20   Alford Highland, MD  ondansetron (ZOFRAN) 4 MG tablet Take 1 tablet (4  mg total) by mouth every 8 (eight) hours as needed for nausea or vomiting. 04/18/20   Alford Highland, MD  polyethylene glycol (MIRALAX / GLYCOLAX) 17 g packet Take 17 g by mouth daily as needed for severe constipation. 04/18/20   Alford Highland, MD  Simethicone 125 MG CAPS Take 125 mg by mouth every 6 (six) hours as needed.    [provider]    Family History Family History  Problem Relation Age of Onset   Diabetes Mellitus II Mother    Hypertension Mother    Hypertension Father     Social History Social History   Tobacco Use   Smoking status: Former    Types: Cigarettes   Smokeless tobacco: Never  Vaping Use   Vaping status: Never Used  Substance Use Topics   Alcohol use: No   Drug use: Never     Allergies   Amlodipine and Ciprofloxacin   Review of Systems Review of Systems  Constitutional:  Positive for chills, fatigue and fever. Negative for diaphoresis.  HENT:  Positive for congestion, rhinorrhea and sore throat. Negative for ear pain, sinus pressure and sinus pain.   Respiratory:  Positive for cough and shortness of breath.   Cardiovascular:  Positive for chest pain.  Gastrointestinal:  Negative for abdominal pain, nausea and vomiting.  Musculoskeletal:  Positive for myalgias.  Skin:  Negative for rash.  Neurological:  Positive for dizziness and headaches. Negative for weakness.  Hematological:  Negative for adenopathy.     Physical Exam Triage Vital Signs ED Triage Vitals  Encounter Vitals Group     BP 03/13/23 1159 130/84     Systolic BP Percentile --      Diastolic BP Percentile --      Pulse Rate 03/13/23 1159 (!) 104     Resp 03/13/23 1159 20     Temp 03/13/23 1159 99.3 F (37.4 C)     Temp Source 03/13/23 1159 Oral     SpO2 03/13/23 1159 99 %     Weight --      Height --      Head Circumference --      Peak Flow --      Pain Score 03/13/23 1157 9     Pain Loc --      Pain Education --      Exclude from Growth Chart --    No data  found.  Updated Vital Signs BP 130/84   Pulse (!) 104   Temp 99.3 F (37.4 C) (Oral)   Resp 20   LMP 03/11/2023   SpO2 99%   Physical Exam Vitals and  nursing note reviewed.  Constitutional:      General: She is not in acute distress.    Appearance: Normal appearance. She is obese. She is ill-appearing. She is not toxic-appearing.  HENT:     Head: Normocephalic and atraumatic.     Nose: Congestion present.     Mouth/Throat:     Mouth: Mucous membranes are moist.     Pharynx: Oropharynx is clear. Posterior oropharyngeal erythema present.  Eyes:     General: No scleral icterus.       Right eye: No discharge.        Left eye: No discharge.     Conjunctiva/sclera: Conjunctivae normal.  Cardiovascular:     Rate and Rhythm: Regular rhythm. Tachycardia present.     Heart sounds: Normal heart sounds.  Pulmonary:     Effort: Pulmonary effort is normal. No respiratory distress.     Breath sounds: Wheezing (bilatera upper lung fields) present.  Musculoskeletal:     Cervical back: Neck supple.  Skin:    General: Skin is dry.  Neurological:     General: No focal deficit present.     Mental Status: She is alert. Mental status is at baseline.     Motor: No weakness.     Gait: Gait normal.  Psychiatric:        Mood and Affect: Mood normal.        Behavior: Behavior normal.      UC Treatments / Results  Labs (all labs ordered are listed, but only abnormal results are displayed) Labs Reviewed  RESP PANEL BY RT-PCR (FLU A&B, COVID) ARPGX2 - Abnormal; Notable for the following components:      Result Value   Influenza A by PCR POSITIVE (*)    All other components within normal limits  GROUP A STREP BY PCR    EKG   Radiology DG Chest 2 View Result Date: 03/13/2023 CLINICAL DATA:  Chest pain and cough EXAM: CHEST - 2 VIEW COMPARISON:  X-ray 02/14/2022 FINDINGS: Underinflation. No consolidation, pneumothorax or effusion. No edema. Normal cardiopericardial silhouette.  IMPRESSION: Underinflation.  No acute cardiopulmonary disease. Electronically Signed   By: Karen Kays M.D.   On: 03/13/2023 13:01    Procedures ED EKG  Date/Time: 03/13/2023 1:16 PM  Performed by: Shirlee Latch, PA-C Authorized by: Domenick Gong, MD   Previous ECG:    Previous ECG:  Compared to current   Similarity:  No change Interpretation:    Interpretation: abnormal   Rate:    ECG rate:  95   ECG rate assessment: normal   Rhythm:    Rhythm: sinus rhythm   Ectopy:    Ectopy: none   QRS:    QRS axis:  Normal   QRS intervals:  Normal   QRS conduction: normal   ST segments:    ST segments:  Normal T waves:    T waves: normal   Other findings:    Other findings: LVH   Comments:     Normal sinus rhythm.  Rate 95 bpm.  Possible LVH.  This compared to EKG from 07/27/2018 shows no significant changes.  (including critical care time)  Medications Ordered in UC Medications - No data to display  Initial Impression / Assessment and Plan / UC Course  I have reviewed the triage vital signs and the nursing notes.  Pertinent labs & imaging results that were available during my care of the patient were reviewed by me and considered in my medical decision  making (see chart for details).   48 year old female with history of asthma/COPD, diabetes, autoimmune disease presents for onset of fever, fatigue, dizziness, headaches, cough, congestion, sore throat, chest pain/pressure and shortness of breath yesterday.  Patient is mildly tachycardic at 104 bpm.  She is ill-appearing and fatigued.  Nontoxic.  Currently afebrile.  On exam has nasal congestion and erythema posterior pharynx.  Few scattered wheezes throughout bilateral upper lung fields.  Respiratory panel, strep testing, chest x-ray and EKG obtained.  EKG shows normal sinus rhythm with a rate of 95 bpm and possible LVH.  No changes when compared to EKG from June 2020.  Chest x-ray without any evidence of acute  cardiopulmonary disease.  Does show underinflation.  Strep negative.  Respiratory panel positive for influenza A.  Reviewed all results with patient.  Patient is interested in Tamiflu after going [facility].  Sent Tamiflu to pharmacy as well as prednisone for asthma/COPD exacerbation.  Additionally sent Promethazine DM.  Reviewed current CDC guidelines, isolation protocol and ED precautions for flu.  Reviewed going to ED for any uncontrolled fever, weakness, worsening chest pain/pressure or increased breathing difficulty.  Encouraged her to use her inhaler at home.  Work note was provided.  Acute illness with systemic symptoms and flareup of chronic underlying condition.  Final Clinical Impressions(s) / UC Diagnoses   Final diagnoses:  Chest pain, unspecified type  Influenza A  Shortness of breath  Acute cough  Asthma with acute exacerbation, unspecified asthma severity, unspecified whether persistent     Discharge Instructions      -You have influenza A.  Other workup was negative. Isolate till fever free 24 hours and feeling better. - I sent Tamiflu to the pharmacy. - Your asthma is a little flared up so I sent prednisone.  Use your inhaler at home. - Also sent cough medicine. - Flu symptoms can last for couple weeks. - You need to be seen again if you have uncontrolled fever, or not breaking fever in a few days, worsening chest discomfort, increased breathing difficulty or weakness.     ED Prescriptions     Medication Sig Dispense Auth. Provider   oseltamivir (TAMIFLU) 75 MG capsule Take 1 capsule (75 mg total) by mouth every 12 (twelve) hours for 5 days. 10 capsule Eusebio Friendly B, PA-C   predniSONE (DELTASONE) 20 MG tablet Take 2 tablets (40 mg total) by mouth daily for 5 days. 10 tablet Shirlee Latch, PA-C   promethazine-dextromethorphan (PROMETHAZINE-DM) 6.25-15 MG/5ML syrup Take 5 mLs by mouth 4 (four) times daily as needed. 118 mL Shirlee Latch, PA-C       PDMP not reviewed this encounter.   Shirlee Latch, PA-C 03/13/23 1322

## 2023-03-21 ENCOUNTER — Ambulatory Visit
Admission: EM | Admit: 2023-03-21 | Discharge: 2023-03-21 | Disposition: A | Discharge status: Home / Self Care | Payer: BC Managed Care – PPO

## 2023-03-21 DIAGNOSIS — H1033 Unspecified acute conjunctivitis, bilateral: Secondary | ICD-10-CM

## 2023-03-21 DIAGNOSIS — J441 Chronic obstructive pulmonary disease with (acute) exacerbation: Secondary | ICD-10-CM | POA: Diagnosis not present

## 2023-03-21 DIAGNOSIS — R058 Other specified cough: Secondary | ICD-10-CM | POA: Diagnosis not present

## 2023-03-21 DIAGNOSIS — I10 Essential (primary) hypertension: Secondary | ICD-10-CM

## 2023-03-21 MED ORDER — POLYMYXIN B-TRIMETHOPRIM 10000-0.1 UNIT/ML-% OP SOLN: 1.0000 [drp] | Freq: Four times a day (QID) | OPHTHALMIC | 0 refills | Status: AC

## 2023-03-21 MED ORDER — DOXYCYCLINE HYCLATE 100 MG PO CAPS: 100.0000 mg | ORAL_CAPSULE | Freq: Two times a day (BID) | ORAL | 0 refills | Status: AC

## 2023-03-21 MED ORDER — METOPROLOL TARTRATE 100 MG PO TABS: 100.0000 mg | ORAL_TABLET | Freq: Two times a day (BID) | ORAL | 0 refills | Status: AC

## 2023-03-21 MED ORDER — OLMESARTAN MEDOXOMIL-HCTZ 40-12.5 MG PO TABS: 1.0000 | ORAL_TABLET | Freq: Every day | ORAL | 0 refills | Status: AC

## 2023-03-21 MED ORDER — ALBUTEROL SULFATE HFA 108 (90 BASE) MCG/ACT IN AERS: 1.0000 | INHALATION_SPRAY | Freq: Four times a day (QID) | RESPIRATORY_TRACT | 0 refills | Status: AC | PRN

## 2023-03-21 MED ORDER — METOPROLOL TARTRATE 100 MG PO TABS: 100.0000 mg | ORAL_TABLET | Freq: Two times a day (BID) | ORAL | 0 refills | Status: DC

## 2023-03-21 NOTE — ED Provider Notes (Signed)
 [facility]    CSN: [id number] Arrival date & time: 03/21/23  1623      History   Chief Complaint Chief Complaint  Patient presents with   Eye Drainage   Headache    HPI Tamara Higgins is a 48 y.o. female presenting for a little over 1 week history of bilateral eye redness/drainage/swelling, cough, congestion, headaches and fatigue. Reports cough has been productive of green sputum and she has been SOB. Hx COPD.  No recent fever. Patient was diagnosed with influenza A in this clinic by me 8 days ago.   Patient also has elevated blood pressure of 190/106 but says that she has been out of her blood pressure medicine for the past couple days and does not have an appointment to see her doctor for another week and a half.  Not reporting any dizziness, severe headaches, chest pain or weakness.  HPI  Past Medical History:  Diagnosis Date   Arthritis    COPD (chronic obstructive pulmonary disease) (HCC)    Diabetes mellitus without complication (HCC)    Hypertension    Morbid obesity (HCC)     Patient Active Problem List   Diagnosis Date Noted   Morbid obesity with BMI of 60.0-69.9, adult (HCC) 11/19/2020   Positive ANA (antinuclear antibody)    Ds DNA antibody positive    Hypomagnesemia    Constipation    Pica    Dehydration    Hypokalemia    COPD (chronic obstructive pulmonary disease) (HCC) 04/13/2020   Hypertension 04/13/2020   Cervical high risk HPV (human papillomavirus) test positive 10/17/2019   Abnormal uterine bleeding (AUB) 10/17/2019   Primary osteoarthritis of knee 03/12/2019   Pancreatitis 07/28/2018   Diabetes mellitus with complication (HCC) 11/19/2017   Arthritis 09/14/2017   Kidney stone on left side 03/10/2016   Tobacco use disorder 02/28/2013   Mild intermittent asthma without complication 01/18/2011   Morbid obesity (HCC) 05/24/2010   Obstructive sleep apnea 05/24/2010    Past Surgical History:  Procedure Laterality Date   CESAREAN  SECTION      OB History     Gravida  1   Para      Term      Preterm      AB      Living         SAB      IAB      Ectopic      Multiple      Live Births               Home Medications    Prior to Admission medications   Medication Sig Start Date End Date Taking? Authorizing Provider  LANTUS  100 UNIT/ML injection Inject into the skin. 02/15/23  Yes [provider]  metFORMIN  (GLUCOPHAGE ) 500 MG tablet Take 1 tablet (500 mg total) by mouth 2 (two) times daily with a meal. 04/18/20  Yes Wieting, Richard, MD  acetaminophen  (TYLENOL ) 650 MG CR tablet Take 650 mg by mouth every 8 (eight) hours as needed for pain.    [provider]  albuterol  (VENTOLIN  HFA) 108 (90 Base) MCG/ACT inhaler Inhale 1-2 puffs into the lungs every 6 (six) hours as needed for wheezing or shortness of breath. 03/21/23  Yes Arvis Jolan NOVAK, PA-C  amLODipine  (NORVASC ) 10 MG tablet Take 1 tablet (10 mg total) by mouth daily. 07/31/18   Gouru, Aruna, MD  atorvastatin (LIPITOR) 10 MG tablet Take 10 mg by mouth every morning. 10/11/20  [provider]  CREON  36000-114000 units CPEP capsule Take 72,000 Units by mouth 3 (three) times daily. 08/02/20   [provider]  doxycycline  (VIBRAMYCIN ) 100 MG capsule Take 1 capsule (100 mg total) by mouth 2 (two) times daily for 7 days. 03/21/23 03/28/23 Yes Arvis Jolan NOVAK, PA-C  magnesium  oxide (MAG-OX) 400 (241.3 Mg) MG tablet Take 1 tablet (400 mg total) by mouth 2 (two) times daily. 04/18/20   Josette Ade, MD  metoprolol  tartrate (LOPRESSOR ) 100 MG tablet Take 1 tablet (100 mg total) by mouth 2 (two) times daily for 14 days. 03/21/23 04/04/23  Arvis Jolan B, PA-C  olmesartan -hydrochlorothiazide (BENICAR  HCT) 40-12.5 MG tablet Take 1 tablet by mouth daily for 14 days. 03/21/23 04/04/23 Yes Arvis Jolan B, PA-C  ondansetron  (ZOFRAN ) 4 MG tablet Take 1 tablet (4 mg total) by mouth every 8 (eight) hours as needed for nausea or vomiting.  04/18/20   Josette Ade, MD  polyethylene glycol (MIRALAX  / GLYCOLAX ) 17 g packet Take 17 g by mouth daily as needed for severe constipation. 04/18/20   Josette Ade, MD  promethazine -dextromethorphan (PROMETHAZINE -DM) 6.25-15 MG/5ML syrup Take 5 mLs by mouth 4 (four) times daily as needed. 03/13/23   Arvis Jolan NOVAK, PA-C  Simethicone 125 MG CAPS Take 125 mg by mouth every 6 (six) hours as needed.    [provider]  trimethoprim -polymyxin b  (POLYTRIM ) ophthalmic solution Place 1 drop into both eyes every 6 (six) hours for 7 days. 03/21/23 03/28/23 Yes Arvis Jolan NOVAK, PA-C    Family History Family History  Problem Relation Age of Onset   Diabetes Mellitus II Mother    Hypertension Mother    Hypertension Father     Social History Social History   Tobacco Use   Smoking status: Former    Types: Cigarettes   Smokeless tobacco: Never  Vaping Use   Vaping status: Never Used  Substance Use Topics   Alcohol use: No   Drug use: Never     Allergies   Amlodipine  and Ciprofloxacin   Review of Systems Review of Systems  Constitutional:  Positive for fatigue. Negative for chills, diaphoresis and fever.  HENT:  Positive for congestion, facial swelling and rhinorrhea. Negative for ear pain, sinus pressure, sinus pain and sore throat.   Eyes:  Positive for discharge and redness. Negative for photophobia and pain.  Respiratory:  Positive for cough and shortness of breath.   Cardiovascular:  Negative for chest pain.  Gastrointestinal:  Negative for abdominal pain, nausea and vomiting.  Musculoskeletal:  Negative for arthralgias and myalgias.  Skin:  Negative for rash.  Neurological:  Positive for headaches. Negative for weakness.  Hematological:  Negative for adenopathy.     Physical Exam Triage Vital Signs ED Triage Vitals [03/21/23 1725]  Encounter Vitals Group     BP      Systolic BP Percentile      Diastolic BP Percentile      Pulse      Resp      Temp      Temp  src      SpO2      Weight      Height      Head Circumference      Peak Flow      Pain Score 7     Pain Loc      Pain Education      Exclude from Growth Chart    No data found.  Updated Vital Signs BP ROLLEN)  190/106 (BP Location: Left Wrist)   Pulse 91   Temp 98.6 F (37 C) (Oral)   Resp 17   LMP 03/11/2023   SpO2 94%      Physical Exam Vitals and nursing note reviewed.  Constitutional:      General: She is not in acute distress.    Appearance: Normal appearance. She is obese. She is not ill-appearing or toxic-appearing.  HENT:     Head: Normocephalic and atraumatic.     Right Ear: Tympanic membrane, ear canal and external ear normal.     Left Ear: Tympanic membrane, ear canal and external ear normal.     Nose: Congestion present.     Mouth/Throat:     Mouth: Mucous membranes are moist.     Pharynx: Oropharynx is clear.  Eyes:     General: No scleral icterus.       Right eye: Discharge present.        Left eye: Discharge present.    Conjunctiva/sclera:     Right eye: Right conjunctiva is injected.     Left eye: Left conjunctiva is injected.     Comments: Mild swelling of bilateral upper and lower eyelids.  Yellowish drainage from both eyes.  Cardiovascular:     Rate and Rhythm: Normal rate and regular rhythm.     Heart sounds: Normal heart sounds.  Pulmonary:     Effort: Pulmonary effort is normal. No respiratory distress.     Breath sounds: Normal breath sounds.  Musculoskeletal:     Cervical back: Neck supple.  Skin:    General: Skin is dry.  Neurological:     General: No focal deficit present.     Mental Status: She is alert. Mental status is at baseline.     Motor: No weakness.     Gait: Gait normal.  Psychiatric:        Mood and Affect: Mood normal.        Behavior: Behavior normal.      UC Treatments / Results  Labs (all labs ordered are listed, but only abnormal results are displayed) Labs Reviewed - No data to  display  EKG   Radiology No results found.  Procedures Procedures (including critical care time)  Medications Ordered in UC Medications - No data to display  Initial Impression / Assessment and Plan / UC Course  I have reviewed the triage vital signs and the nursing notes.  Pertinent labs & imaging results that were available during my care of the patient were reviewed by me and considered in my medical decision making (see chart for details).   48 year old female presents for over 1 week history of fatigue, cough, congestion, bilateral eye redness and drainage.  Reports cough is productive of yellowish-green sputum and she feels more short of breath than baseline.  History of COPD.  Out of albuterol  inhaler.  Also has elevated blood pressure and has been out of blood pressure medicine for a few days.  Current blood pressure 190/106.  Other vitals normal and stable.  She is ill-appearing but nontoxic.  On exam has swelling of bilateral upper and lower eyelids with injection of conjunctiva and yellowish drainage.  Also has nasal congestion.  Throat clear.  Chest clear auscultation.  Heart regular rate and rhythm.  Suspect mild COPD exacerbation.  Refilled albuterol  inhaler.  Sent doxycycline  to pharmacy.  Explained that her conjunctivitis is likely viral but will treat at this time with Polytrim  in case there is bacterial component given  persistent symptoms which she feels are worsening.  Already tried Ciloxan eyedrops without relief.  Refilled metoprolol  and Benicar .  Patient has an appointment to see her primary care provider in 9 days.  Vies to keep appointment.  Acute illness.  Flareup of 2 chronic underlying conditions.   Final Clinical Impressions(s) / UC Diagnoses   Final diagnoses:  COPD exacerbation (HCC)  Acute bacterial conjunctivitis of both eyes  Productive cough  Essential hypertension     Discharge Instructions      -Mild flare of COPD related to flu.  I sent  antibiotics to pharmacy as well as refilled your inhaler. - Take Mucinex and increase fluids and rest.  Home - Your eye problem is likely viral conjunctivitis but I sent antibiotic drop which will hopefully help.  Use warm compresses on your eyes frequently.  If you feel that your swelling or discomfort worsens please see an eye doctor or go to the ER. - Refilled your blood pressure medicines.  Keep a log your blood pressure and follow-up with your primary care provider as scheduled in the next week and a half.     ED Prescriptions     Medication Sig Dispense Auth. Provider   doxycycline  (VIBRAMYCIN ) 100 MG capsule Take 1 capsule (100 mg total) by mouth 2 (two) times daily for 7 days. 14 capsule Arvis Huxley B, PA-C   trimethoprim -polymyxin b  (POLYTRIM ) ophthalmic solution Place 1 drop into both eyes every 6 (six) hours for 7 days. 10 mL Arvis Huxley B, PA-C   albuterol  (VENTOLIN  HFA) 108 (90 Base) MCG/ACT inhaler Inhale 1-2 puffs into the lungs every 6 (six) hours as needed for wheezing or shortness of breath. 1 g Arvis Huxley B, PA-C   olmesartan -hydrochlorothiazide (BENICAR  HCT) 40-12.5 MG tablet Take 1 tablet by mouth daily for 14 days. 14 tablet Arvis Huxley B, PA-C   metoprolol  tartrate (LOPRESSOR ) 100 MG tablet  (Status: Discontinued) Take 1 tablet (100 mg total) by mouth 2 (two) times daily for 14 days. 28 tablet Arvis Huxley B, PA-C   metoprolol  tartrate (LOPRESSOR ) 100 MG tablet Take 1 tablet (100 mg total) by mouth 2 (two) times daily for 14 days. 28 tablet Arvis Huxley NOVAK, PA-C      PDMP not reviewed this encounter.   Arvis Huxley NOVAK, PA-C 03/21/23 [phone]

## 2023-03-21 NOTE — ED Triage Notes (Signed)
 Patient c/o bilateral eye pain and drainage x 1 week, and a headache. Also, patient states she has not taken her BP medication x 2 days.

## 2023-03-21 NOTE — Discharge Instructions (Signed)
-  Mild flare of COPD related to flu.  I sent antibiotics to pharmacy as well as refilled your inhaler. - Take Mucinex and increase fluids and rest.  Home - Your eye problem is likely viral conjunctivitis but I sent antibiotic drop which will hopefully help.  Use warm compresses on your eyes frequently.  If you feel that your swelling or discomfort worsens please see an eye doctor or go to the ER. - Refilled your blood pressure medicines.  Keep a log your blood pressure and follow-up with your primary care provider as scheduled in the next week and a half.

## 2023-09-11 ENCOUNTER — Ambulatory Visit (INDEPENDENT_AMBULATORY_CARE_PROVIDER_SITE_OTHER)

## 2023-09-11 ENCOUNTER — Ambulatory Visit: Admission: EM | Admit: 2023-09-11 | Discharge: 2023-09-11 | Disposition: A | Discharge status: Home / Self Care

## 2023-09-11 DIAGNOSIS — R0602 Shortness of breath: Secondary | ICD-10-CM | POA: Diagnosis not present

## 2023-09-11 DIAGNOSIS — J441 Chronic obstructive pulmonary disease with (acute) exacerbation: Secondary | ICD-10-CM | POA: Diagnosis not present

## 2023-09-11 DIAGNOSIS — R052 Subacute cough: Secondary | ICD-10-CM

## 2023-09-11 DIAGNOSIS — I10 Essential (primary) hypertension: Secondary | ICD-10-CM | POA: Diagnosis not present

## 2023-09-11 MED ORDER — PROMETHAZINE-DM 6.25-15 MG/5ML PO SYRP: 5.0000 mL | ORAL_SOLUTION | Freq: Four times a day (QID) | ORAL | 0 refills | Status: AC | PRN

## 2023-09-11 MED ORDER — DOXYCYCLINE HYCLATE 100 MG PO CAPS: 100.0000 mg | ORAL_CAPSULE | Freq: Two times a day (BID) | ORAL | 0 refills | Status: AC

## 2023-09-11 MED ORDER — IPRATROPIUM-ALBUTEROL 0.5-2.5 (3) MG/3ML IN SOLN
3.0000 mL | Freq: Once | RESPIRATORY_TRACT | Status: AC
  Administered 2023-09-11: 3 mL via RESPIRATORY_TRACT

## 2023-09-11 NOTE — Discharge Instructions (Addendum)
-  Will call with xray results -Sent more antibiotics -Use duonebs 3-4 times daily until breathing improves -Finish prednisone  -Continue BP meds -I sent cough medicine -Make in person PCP or pulmonology follow up next week or sooner if not feeling better. If not improving in a couple of days or symptoms worsen go to ER

## 2023-09-11 NOTE — ED Provider Notes (Signed)
 [facility]    CSN: [id number] Arrival date & time: 09/11/23  0944      History   Chief Complaint Chief Complaint  Patient presents with   Shortness of Breath   Wheezing    HPI Tamara Higgins is a 48 y.o. female presenting for 3 week history of cough, congestion, headaches and fatigue. Reports cough has been productive of green sputum and she has been SOB. Hx COPD.  No recent fever. Patient was treated on 7/18 and 7/26 via telemedicine with prednisone  and antibiotics. She received azithromycin  on 7/18. Received more prednisone  a couple days ago and is still on them. Patient has not taken a second antibiotic. Has a duoneb that she has been doing once a day.   Patient also has elevated blood pressure of 193/110 . Re-check is 169/98. Not reporting any dizziness, severe headaches, chest pain or weakness.  HPI  Past Medical History:  Diagnosis Date   Arthritis    COPD (chronic obstructive pulmonary disease) (HCC)    Diabetes mellitus without complication (HCC)    Hypertension    Morbid obesity (HCC)     Patient Active Problem List   Diagnosis Date Noted   Morbid obesity with BMI of 60.0-69.9, adult (HCC) 11/19/2020   Positive ANA (antinuclear antibody)    Ds DNA antibody positive    Hypomagnesemia    Constipation    Pica    Dehydration    Hypokalemia    COPD (chronic obstructive pulmonary disease) (HCC) 04/13/2020   Hypertension 04/13/2020   Cervical high risk HPV (human papillomavirus) test positive 10/17/2019   Abnormal uterine bleeding (AUB) 10/17/2019   Primary osteoarthritis of knee 03/12/2019   Pancreatitis 07/28/2018   Diabetes mellitus with complication (HCC) 11/19/2017   Arthritis 09/14/2017   Kidney stone on left side 03/10/2016   Tobacco use disorder 02/28/2013   Mild intermittent asthma without complication 01/18/2011   Morbid obesity (HCC) 05/24/2010   Obstructive sleep apnea 05/24/2010    Past Surgical History:  Procedure Laterality Date    CESAREAN SECTION      OB History     Gravida  1   Para      Term      Preterm      AB      Living         SAB      IAB      Ectopic      Multiple      Live Births               Home Medications    Prior to Admission medications   Medication Sig Start Date End Date Taking? Authorizing Provider  atorvastatin (LIPITOR) 10 MG tablet Take 10 mg by mouth every morning. 10/11/20  Yes [provider]  buPROPion (WELLBUTRIN SR) 150 MG 12 hr tablet  04/18/19  Yes [provider]  cefpodoxime (VANTIN) 200 MG tablet SMARTSIG:1 Tablet(s) By Mouth Every 12 Hours 07/02/23  Yes [provider]  doxycycline  (VIBRAMYCIN ) 100 MG capsule Take 1 capsule (100 mg total) by mouth 2 (two) times daily for 7 days. 09/11/23 09/18/23 Yes Arvis Huxley B, PA-C  furosemide (LASIX) 20 MG tablet TAKE ONE TABLET BY MOUTH ONCE DAILY AS NEEDED FOR SWELLING 04/18/19  Yes [provider]  gabapentin (NEURONTIN) 100 MG capsule Take 1 capsule by mouth at bedtime. 12/13/21  Yes [provider]  LANTUS  100 UNIT/ML injection Inject into the skin. 02/15/23  Yes [provider]  metFORMIN  (GLUCOPHAGE ) 500 MG tablet Take 1 tablet (500 mg total) by mouth 2 (two) times daily with a meal. 04/18/20  Yes Wieting, Richard, MD  promethazine -dextromethorphan (PROMETHAZINE -DM) 6.25-15 MG/5ML syrup Take 5 mLs by mouth 4 (four) times daily as needed. 09/11/23  Yes Arvis Jolan NOVAK, PA-C  acetaminophen  (TYLENOL ) 650 MG CR tablet Take 650 mg by mouth every 8 (eight) hours as needed for pain.    [provider]  albuterol  (VENTOLIN  HFA) 108 (90 Base) MCG/ACT inhaler Inhale 1-2 puffs into the lungs every 6 (six) hours as needed for wheezing or shortness of breath. 03/21/23   Arvis Jolan NOVAK, PA-C  amLODipine  (NORVASC ) 10 MG tablet Take 1 tablet (10 mg total) by mouth daily. 07/31/18   Gouru, Aruna, MD  CREON  36000-114000 units CPEP capsule Take 72,000 Units by mouth 3 (three)  times daily. 08/02/20   [provider]  escitalopram (LEXAPRO) 10 MG tablet Take 10 mg by mouth daily.    [provider]  glipiZIDE  (GLUCOTROL ) 5 MG tablet Take 1 tablet by mouth 2 (two) times daily.    [provider]  hydrochlorothiazide (HYDRODIURIL) 25 MG tablet Take 1 tablet by mouth daily. 04/18/19   [provider]  JARDIANCE 25 MG TABS tablet Take 25 mg by mouth daily.    [provider]  lisinopril (ZESTRIL) 10 MG tablet Take 1 tablet by mouth daily.    [provider]  magnesium  oxide (MAG-OX) 400 (241.3 Mg) MG tablet Take 1 tablet (400 mg total) by mouth 2 (two) times daily. 04/18/20   Josette Ade, MD  metoprolol  tartrate (LOPRESSOR ) 100 MG tablet Take 1 tablet (100 mg total) by mouth 2 (two) times daily for 14 days. 03/21/23 04/04/23  Arvis Jolan B, PA-C  olmesartan -hydrochlorothiazide (BENICAR  HCT) 40-12.5 MG tablet Take 1 tablet by mouth daily for 14 days. 03/21/23 04/04/23  Arvis Jolan NOVAK, PA-C  ondansetron  (ZOFRAN ) 4 MG tablet Take 1 tablet (4 mg total) by mouth every 8 (eight) hours as needed for nausea or vomiting. 04/18/20   Josette Ade, MD  polyethylene glycol (MIRALAX  / GLYCOLAX ) 17 g packet Take 17 g by mouth daily as needed for severe constipation. 04/18/20   Josette Ade, MD  Simethicone 125 MG CAPS Take 125 mg by mouth every 6 (six) hours as needed.    [provider]    Family History Family History  Problem Relation Age of Onset   Diabetes Mellitus II Mother    Hypertension Mother    Hypertension Father     Social History Social History   Tobacco Use   Smoking status: Former    Types: Cigarettes   Smokeless tobacco: Never  Vaping Use   Vaping status: Never Used  Substance Use Topics   Alcohol use: No   Drug use: Never     Allergies   Amlodipine  and Ciprofloxacin   Review of Systems Review of Systems  Constitutional:  Positive for fatigue. Negative for chills, diaphoresis and fever.   HENT:  Positive for congestion and rhinorrhea. Negative for ear pain, sinus pressure, sinus pain and sore throat.   Eyes:  Negative for photophobia, pain, discharge and redness.  Respiratory:  Positive for cough and shortness of breath.   Cardiovascular:  Negative for chest pain.  Gastrointestinal:  Negative for abdominal pain, nausea and vomiting.  Musculoskeletal:  Negative for arthralgias and myalgias.  Skin:  Negative for rash.  Neurological:  Positive for headaches. Negative for weakness.  Hematological:  Negative  for adenopathy.     Physical Exam Triage Vital Signs  No data found.  Updated Vital Signs BP (!) 169/98 (BP Location: Right Arm)   Pulse 80   Temp 98.6 F (37 C) (Oral)   Resp (!) 24   SpO2 95%     BP Readings from Last 3 Encounters:  09/11/23 (!) 169/98  03/21/23 (!) 190/106  03/13/23 130/84     Physical Exam Vitals and nursing note reviewed.  Constitutional:      General: She is not in acute distress.    Appearance: Normal appearance. She is obese. She is ill-appearing. She is not toxic-appearing.  HENT:     Head: Normocephalic and atraumatic.     Right Ear: Tympanic membrane, ear canal and external ear normal.     Left Ear: Tympanic membrane, ear canal and external ear normal.     Nose: Congestion present.     Mouth/Throat:     Mouth: Mucous membranes are moist.     Pharynx: Oropharynx is clear.  Eyes:     General: No scleral icterus.       Right eye: No discharge.        Left eye: No discharge.  Cardiovascular:     Rate and Rhythm: Normal rate and regular rhythm.     Heart sounds: Normal heart sounds.  Pulmonary:     Effort: Pulmonary effort is normal. No respiratory distress.     Breath sounds: Wheezing present.  Musculoskeletal:     Cervical back: Neck supple.  Skin:    General: Skin is dry.  Neurological:     General: No focal deficit present.     Mental Status: She is alert. Mental status is at baseline.     Motor: No weakness.      Gait: Gait normal.  Psychiatric:        Mood and Affect: Mood normal.        Behavior: Behavior normal.      UC Treatments / Results  Labs (all labs ordered are listed, but only abnormal results are displayed) Labs Reviewed - No data to display  EKG   Radiology DG Chest 2 View Result Date: 09/11/2023 CLINICAL DATA:  3 week hx shortness of breath and cough. hx COPD EXAM: CHEST - 2 VIEW COMPARISON:  March 13, 2023 FINDINGS: The study is degraded by patient's body habitus. Lower lung volumes. No focal airspace consolidation, pleural effusion, or pneumothorax. No cardiomegaly. No acute fracture or destructive lesion. Multilevel thoracic osteophytosis. IMPRESSION: No acute cardiopulmonary abnormality. Electronically Signed   By: Rogelia Myers M.D.   On: 09/11/2023 12:38    Procedures Procedures (including critical care time)  Medications Ordered in UC Medications  ipratropium-albuterol  (DUONEB) 0.5-2.5 (3) MG/3ML nebulizer solution 3 mL (3 mLs Nebulization Given 09/11/23 1211)    Initial Impression / Assessment and Plan / UC Course  I have reviewed the triage vital signs and the nursing notes.  Pertinent labs & imaging results that were available during my care of the patient were reviewed by me and considered in my medical decision making (see chart for details).   48 year old female presents for 3 week history of fatigue, cough, congestion, and shortness of breath. Reports cough is productive of yellowish-green sputum and she feels more short of breath than baseline.  History of COPD.  Has taken 2 rounds of prednisone  (currently taking) and azithromycin .   Current blood pressure 190/106.  Other vitals normal and stable.  She is ill-appearing but nontoxic.  On exam has nasal congestion.  Throat clear.  Chest with few expiratory wheezes.  Heart regular rate and rhythm.  BP re-check 169/98. Advised to continue BP meds.  CXR wet read without obvious pneumonia.  Advised patient  we will call if treatment needs to change based on radiology overread.  Suspect COPD exacerbation. Patient given duoneb. Advised to increase neb frequency at home.  Sent doxycycline  to pharmacy.  Advised to complete prednisone  as prescribed.  Supportive care.  Thoroughly reviewed ER precautions.  Acute illness.  Flareup of 2 chronic underlying conditions.  X-ray overread without acute abnormality. No change to treatment plan.   Final Clinical Impressions(s) / UC Diagnoses   Final diagnoses:  Subacute cough  COPD exacerbation (HCC)  Shortness of breath  Essential hypertension     Discharge Instructions      -Will call with xray results -Sent more antibiotics -Use duonebs 3-4 times daily until breathing improves -Finish prednisone  -Continue BP meds -I sent cough medicine -Make in person PCP or pulmonology follow up next week or sooner if not feeling better. If not improving in a couple of days or symptoms worsen go to ER      ED Prescriptions     Medication Sig Dispense Auth. Provider   doxycycline  (VIBRAMYCIN ) 100 MG capsule Take 1 capsule (100 mg total) by mouth 2 (two) times daily for 7 days. 14 capsule Arvis Huxley B, PA-C   promethazine -dextromethorphan (PROMETHAZINE -DM) 6.25-15 MG/5ML syrup Take 5 mLs by mouth 4 (four) times daily as needed. 118 mL Arvis Huxley NOVAK, PA-C      PDMP not reviewed this encounter.       Arvis Huxley NOVAK, PA-C 09/11/23 1300

## 2023-09-11 NOTE — ED Triage Notes (Signed)
 Sx x 3 weeks  Patient states that she did a evisit 7-18 and 7-26. Both times she was given prednisone  and an abx. Patient states that it didn't help. Patient states that she has SOB and wheezing. Hx of asthma and copd.

## 2023-09-12 ENCOUNTER — Ambulatory Visit (HOSPITAL_COMMUNITY): Payer: Self-pay

## 2023-09-20 ENCOUNTER — Other Ambulatory Visit: Payer: Self-pay

## 2023-09-20 ENCOUNTER — Emergency Department

## 2023-09-20 ENCOUNTER — Emergency Department
Admission: EM | Admit: 2023-09-20 | Discharge: 2023-09-20 | Disposition: A | Discharge status: Home / Self Care | Attending: Emergency Medicine | Admitting: Emergency Medicine

## 2023-09-20 DIAGNOSIS — Z7984 Long term (current) use of oral hypoglycemic drugs: Secondary | ICD-10-CM | POA: Insufficient documentation

## 2023-09-20 DIAGNOSIS — Z79899 Other long term (current) drug therapy: Secondary | ICD-10-CM | POA: Diagnosis not present

## 2023-09-20 DIAGNOSIS — I10 Essential (primary) hypertension: Secondary | ICD-10-CM | POA: Diagnosis not present

## 2023-09-20 DIAGNOSIS — X500XXA Overexertion from strenuous movement or load, initial encounter: Secondary | ICD-10-CM | POA: Insufficient documentation

## 2023-09-20 DIAGNOSIS — J449 Chronic obstructive pulmonary disease, unspecified: Secondary | ICD-10-CM | POA: Insufficient documentation

## 2023-09-20 DIAGNOSIS — S93401A Sprain of unspecified ligament of right ankle, initial encounter: Secondary | ICD-10-CM | POA: Insufficient documentation

## 2023-09-20 DIAGNOSIS — E119 Type 2 diabetes mellitus without complications: Secondary | ICD-10-CM | POA: Diagnosis not present

## 2023-09-20 DIAGNOSIS — S99911A Unspecified injury of right ankle, initial encounter: Secondary | ICD-10-CM | POA: Diagnosis present

## 2023-09-20 MED ORDER — HYDROCODONE-ACETAMINOPHEN 5-325 MG PO TABS: 2.0000 | ORAL_TABLET | Freq: Three times a day (TID) | ORAL | 0 refills | Status: AC | PRN

## 2023-09-20 MED ORDER — ONDANSETRON 4 MG PO TBDP: 4.0000 mg | ORAL_TABLET | Freq: Four times a day (QID) | ORAL | 0 refills | Status: AC | PRN

## 2023-09-20 MED ORDER — HYDROCODONE-ACETAMINOPHEN 5-325 MG PO TABS
2.0000 | ORAL_TABLET | Freq: Once | ORAL | Status: AC
  Administered 2023-09-20: 2 via ORAL
  Filled 2023-09-20: qty 2

## 2023-09-20 MED ORDER — ONDANSETRON 4 MG PO TBDP
4.0000 mg | ORAL_TABLET | Freq: Once | ORAL | Status: AC
  Administered 2023-09-20: 4 mg via ORAL
  Filled 2023-09-20: qty 1

## 2023-09-20 MED ORDER — IBUPROFEN 800 MG PO TABS: 800.0000 mg | ORAL_TABLET | Freq: Three times a day (TID) | ORAL | 0 refills | Status: AC | PRN

## 2023-09-20 MED ORDER — IBUPROFEN 800 MG PO TABS
800.0000 mg | ORAL_TABLET | Freq: Once | ORAL | Status: AC
  Administered 2023-09-20: 800 mg via ORAL
  Filled 2023-09-20: qty 1

## 2023-09-20 NOTE — Discharge Instructions (Addendum)

## 2023-09-20 NOTE — ED Triage Notes (Signed)
 Pt to ED via EMS from home, pt reports she slipped and fell on the porch injuring her right ankle.

## 2023-09-20 NOTE — ED Provider Notes (Signed)
 [facility] Provider Note    Event Date/Time   First MD Initiated Contact with Patient 09/20/23 [phone]     (approximate)   History   Ankle Pain   HPI  Tamara Higgins is a 48 y.o. female with history of hypertension, diabetes, COPD, morbid obesity who presents to the emergency department with a right ankle injury.  States she twisted her ankle and fell onto her bottom tonight.  Did not hit her head or lose consciousness.  States she has a lot of pain in her ankle anytime she tries to bear weight.   History provided by patient.    Past Medical History:  Diagnosis Date   Arthritis    COPD (chronic obstructive pulmonary disease) (HCC)    Diabetes mellitus without complication (HCC)    Hypertension    Morbid obesity (HCC)     Past Surgical History:  Procedure Laterality Date   CESAREAN SECTION      MEDICATIONS:  Prior to Admission medications   Medication Sig Start Date End Date Taking? Authorizing Provider  acetaminophen  (TYLENOL ) 650 MG CR tablet Take 650 mg by mouth every 8 (eight) hours as needed for pain.    [provider]  albuterol  (VENTOLIN  HFA) 108 (90 Base) MCG/ACT inhaler Inhale 1-2 puffs into the lungs every 6 (six) hours as needed for wheezing or shortness of breath. 03/21/23   Arvis Jolan NOVAK, PA-C  amLODipine  (NORVASC ) 10 MG tablet Take 1 tablet (10 mg total) by mouth daily. 07/31/18   Gouru, Aruna, MD  atorvastatin (LIPITOR) 10 MG tablet Take 10 mg by mouth every morning. 10/11/20   [provider]  buPROPion (WELLBUTRIN SR) 150 MG 12 hr tablet  04/18/19   [provider]  cefpodoxime (VANTIN) 200 MG tablet SMARTSIG:1 Tablet(s) By Mouth Every 12 Hours 07/02/23   [provider]  CREON  36000-114000 units CPEP capsule Take 72,000 Units by mouth 3 (three) times daily. 08/02/20   [provider]  escitalopram (LEXAPRO) 10 MG tablet Take 10 mg by mouth daily.    [provider]  furosemide  (LASIX) 20 MG tablet TAKE ONE TABLET BY MOUTH ONCE DAILY AS NEEDED FOR SWELLING 04/18/19   [provider]  gabapentin (NEURONTIN) 100 MG capsule Take 1 capsule by mouth at bedtime. 12/13/21   [provider]  glipiZIDE  (GLUCOTROL ) 5 MG tablet Take 1 tablet by mouth 2 (two) times daily.    [provider]  hydrochlorothiazide (HYDRODIURIL) 25 MG tablet Take 1 tablet by mouth daily. 04/18/19   [provider]  JARDIANCE 25 MG TABS tablet Take 25 mg by mouth daily.    [provider]  LANTUS  100 UNIT/ML injection Inject into the skin. 02/15/23   [provider]  lisinopril (ZESTRIL) 10 MG tablet Take 1 tablet by mouth daily.    [provider]  magnesium  oxide (MAG-OX) 400 (241.3 Mg) MG tablet Take 1 tablet (400 mg total) by mouth 2 (two) times daily. 04/18/20   Josette Ade, MD  metFORMIN  (GLUCOPHAGE ) 500 MG tablet Take 1 tablet (500 mg total) by mouth 2 (two) times daily with a meal. 04/18/20   Josette Ade, MD  metoprolol  tartrate (LOPRESSOR ) 100 MG tablet Take 1 tablet (100 mg total) by mouth 2 (two) times daily for 14 days. 03/21/23 04/04/23  Arvis Jolan B, PA-C  olmesartan -hydrochlorothiazide (BENICAR  HCT) 40-12.5 MG tablet Take 1 tablet by mouth daily for 14 days. 03/21/23 04/04/23  Arvis Jolan NOVAK, PA-C  ondansetron  (  ZOFRAN ) 4 MG tablet Take 1 tablet (4 mg total) by mouth every 8 (eight) hours as needed for nausea or vomiting. 04/18/20   Josette Ade, MD  polyethylene glycol (MIRALAX  / GLYCOLAX ) 17 g packet Take 17 g by mouth daily as needed for severe constipation. 04/18/20   Josette Ade, MD  promethazine -dextromethorphan (PROMETHAZINE -DM) 6.25-15 MG/5ML syrup Take 5 mLs by mouth 4 (four) times daily as needed. 09/11/23   Arvis Jolan NOVAK, PA-C  Simethicone 125 MG CAPS Take 125 mg by mouth every 6 (six) hours as needed.    [provider]    Physical Exam   Triage Vital Signs: ED Triage Vitals  Encounter Vitals Group      BP 09/20/23 0204 (!) 142/83     Girls Systolic BP Percentile --      Girls Diastolic BP Percentile --      Boys Systolic BP Percentile --      Boys Diastolic BP Percentile --      Pulse Rate 09/20/23 0204 (!) 107     Resp 09/20/23 0204 20     Temp 09/20/23 0204 98.2 F (36.8 C)     Temp Source 09/20/23 0204 Oral     SpO2 09/20/23 0204 99 %     Weight 09/20/23 0203 (!) 380 lb (172.4 kg)     Height 09/20/23 0203 5' 2 (1.575 m)     Head Circumference --      Peak Flow --      Pain Score 09/20/23 0203 10     Pain Loc --      Pain Education --      Exclude from Growth Chart --     Most recent vital signs: Vitals:   09/20/23 0204  BP: (!) 142/83  Pulse: (!) 107  Resp: 20  Temp: 98.2 F (36.8 C)  SpO2: 99%    CONSTITUTIONAL: Alert, responds appropriately to questions. Well-appearing; well-nourished HEAD: Normocephalic, atraumatic EYES: Conjunctivae clear, pupils appear equal, sclera nonicteric ENT: normal nose; moist mucous membranes NECK: Supple, normal ROM CARD: RRR; S1 and S2 appreciated RESP: Normal chest excursion without splinting or tachypnea; breath sounds clear and equal bilaterally; no wheezes, no rhonchi, no rales, no hypoxia or respiratory distress, speaking full sentences ABD/GI: Non-distended; soft, non-tender, no rebound, no guarding, no peritoneal signs BACK: The back appears normal EXT: Diffusely tender over the right ankle with mild soft tissue swelling but exam is somewhat limited due to body habitus.  No tenderness over the proximal fibular head.  No tenderness over the foot.  Normal range of motion of the toes and normal capillary refill, sensation.  2+ right DP pulse.  No calf tenderness or calf swelling.  Compartments of the right leg are soft. SKIN: Normal color for age and race; warm; no rash on exposed skin NEURO: Moves all extremities equally, normal speech PSYCH: The patient's mood and manner are appropriate.   ED Results / Procedures / Treatments    LABS: (all labs ordered are listed, but only abnormal results are displayed) Labs Reviewed - No data to display   EKG:  RADIOLOGY: My personal review and interpretation of imaging: X-ray shows no fracture.  I have personally reviewed all radiology reports.   DG Ankle Complete Right Result Date: 09/20/2023 CLINICAL DATA:  fall EXAM: RIGHT ANKLE - COMPLETE 3+ VIEW COMPARISON:  None Available. FINDINGS: There is no evidence of fracture, dislocation, or joint effusion. Old healed medial malleolar fracture versus ossicle. Posterior and plantar calcaneal spur. There  is no evidence of arthropathy or other focal bone abnormality. Subcutaneus soft tissue edema of the ankle. IMPRESSION: No acute displaced fracture or dislocation. Electronically Signed   By: Morgane  Naveau M.D.   On: 09/20/2023 02:23     PROCEDURES:  Critical Care performed: No      Procedures    IMPRESSION / MDM / ASSESSMENT AND PLAN / ED COURSE  I reviewed the triage vital signs and the nursing notes.    Patient here with right ankle injury.     DIFFERENTIAL DIAGNOSIS (includes but not limited to):   Sprain, fracture, doubt dislocation   Patient's presentation is most consistent with acute complicated illness / injury requiring diagnostic workup.   PLAN: X-ray of the ankle obtained from triage and reviewed/interpreted by myself and the radiologist and show no fracture, dislocation.  Patient neurovascular intact distally.  Will provide crutches to help her get around.  Will place an Ace wrap, provide with ice pack.  Will give her pain medication here and discharged with prescription for the same.  Recommended rest, elevation, ice and compression.  Recommended follow-up with orthopedics if symptoms not improving in 1 to 2 weeks with conservative therapy.   MEDICATIONS GIVEN IN ED: Medications  HYDROcodone -acetaminophen  (NORCO/VICODIN) 5-325 MG per tablet 2 tablet (has no administration in time range)   ondansetron  (ZOFRAN -ODT) disintegrating tablet 4 mg (has no administration in time range)  ibuprofen  (ADVIL ) tablet 800 mg (has no administration in time range)     ED COURSE:  At this time, I do not feel there is any life-threatening condition present. I reviewed all nursing notes, vitals, pertinent previous records.  All lab and urine results, EKGs, imaging ordered have been independently reviewed and interpreted by myself.  I reviewed all available radiology reports from any imaging ordered this visit.  Based on my assessment, I feel the patient is safe to be discharged home without further emergent workup and can continue workup as an outpatient as needed. Discussed all findings, treatment plan as well as usual and customary return precautions.  They verbalize understanding and are comfortable with this plan.  Outpatient follow-up has been provided as needed.  All questions have been answered.    CONSULTS:  none   OUTSIDE RECORDS REVIEWED: Reviewed last family medicine note on 08/31/2023.       FINAL CLINICAL IMPRESSION(S) / ED DIAGNOSES   Final diagnoses:  Sprain of right ankle, unspecified ligament, initial encounter     Rx / DC Orders   ED Discharge Orders          Ordered    HYDROcodone -acetaminophen  (NORCO/VICODIN) 5-325 MG tablet  Every 8 hours PRN        09/20/23 0315    ibuprofen  (ADVIL ) 800 MG tablet  Every 8 hours PRN        09/20/23 0315    ondansetron  (ZOFRAN -ODT) 4 MG disintegrating tablet  Every 6 hours PRN        09/20/23 0315             Note:  This document was prepared using Dragon voice recognition software and may include unintentional dictation errors.   Pascha Fogal, Josette SAILOR, DO 09/20/23 [phone]

## 2023-09-20 NOTE — ED Triage Notes (Signed)
 Pt arrives via EMS from home for reports of falling on slippery porch last night;c/o rt ankle pain
